# Patient Record
Sex: Female | Born: 1965
Health system: Southern US, Community
[De-identification: ages and names within clinical notes are randomized; demographics above are authoritative.]

## PROBLEM LIST (undated history)

## (undated) DIAGNOSIS — Z720 Tobacco use: Secondary | ICD-10-CM

## (undated) DIAGNOSIS — E669 Obesity, unspecified: Secondary | ICD-10-CM

## (undated) DIAGNOSIS — I10 Essential (primary) hypertension: Secondary | ICD-10-CM

## (undated) DIAGNOSIS — J45909 Unspecified asthma, uncomplicated: Secondary | ICD-10-CM

## (undated) HISTORY — DX: Obesity, unspecified: E66.9

## (undated) HISTORY — PX: WISDOM TOOTH EXTRACTION: SHX21

## (undated) HISTORY — DX: Tobacco use: Z72.0

---

## 1999-04-05 ENCOUNTER — Emergency Department (HOSPITAL_COMMUNITY): Admission: EM | Admit: 1999-04-05 | Discharge: 1999-04-05 | Payer: Self-pay

## 1999-09-26 ENCOUNTER — Emergency Department (HOSPITAL_COMMUNITY): Admission: EM | Admit: 1999-09-26 | Discharge: 1999-09-26 | Payer: Self-pay | Admitting: Emergency Medicine

## 2000-10-30 ENCOUNTER — Emergency Department (HOSPITAL_COMMUNITY): Admission: EM | Admit: 2000-10-30 | Discharge: 2000-10-30 | Payer: Self-pay | Admitting: Emergency Medicine

## 2000-10-30 ENCOUNTER — Encounter: Payer: Self-pay | Admitting: Emergency Medicine

## 2000-11-03 ENCOUNTER — Encounter: Admission: RE | Admit: 2000-11-03 | Discharge: 2000-11-17 | Payer: Self-pay | Admitting: Family Medicine

## 2000-12-15 ENCOUNTER — Inpatient Hospital Stay (HOSPITAL_COMMUNITY): Admission: AD | Admit: 2000-12-15 | Discharge: 2000-12-15 | Payer: Self-pay | Admitting: Radiology

## 2000-12-16 ENCOUNTER — Encounter: Payer: Self-pay | Admitting: Obstetrics & Gynecology

## 2000-12-16 ENCOUNTER — Inpatient Hospital Stay (HOSPITAL_COMMUNITY): Admission: AD | Admit: 2000-12-16 | Discharge: 2000-12-16 | Payer: Self-pay | Admitting: *Deleted

## 2000-12-19 ENCOUNTER — Inpatient Hospital Stay (HOSPITAL_COMMUNITY): Admission: AD | Admit: 2000-12-19 | Discharge: 2000-12-19 | Payer: Self-pay | Admitting: Obstetrics & Gynecology

## 2000-12-22 ENCOUNTER — Inpatient Hospital Stay (HOSPITAL_COMMUNITY): Admission: AD | Admit: 2000-12-22 | Discharge: 2000-12-22 | Payer: Self-pay | Admitting: Obstetrics and Gynecology

## 2000-12-22 ENCOUNTER — Encounter: Payer: Self-pay | Admitting: Obstetrics and Gynecology

## 2001-06-26 ENCOUNTER — Encounter: Admission: RE | Admit: 2001-06-26 | Discharge: 2001-06-26 | Payer: Self-pay | Admitting: Family Medicine

## 2001-08-04 ENCOUNTER — Other Ambulatory Visit: Admission: RE | Admit: 2001-08-04 | Discharge: 2001-08-04 | Payer: Self-pay | Admitting: *Deleted

## 2001-08-04 LAB — CONVERTED CEMR LAB: Pap Smear: NORMAL

## 2001-08-29 ENCOUNTER — Inpatient Hospital Stay (HOSPITAL_COMMUNITY): Admission: AD | Admit: 2001-08-29 | Discharge: 2001-08-29 | Payer: Self-pay | Admitting: *Deleted

## 2001-09-12 ENCOUNTER — Encounter: Admission: RE | Admit: 2001-09-12 | Discharge: 2001-09-12 | Payer: Self-pay | Admitting: Family Medicine

## 2001-09-20 ENCOUNTER — Ambulatory Visit (HOSPITAL_COMMUNITY): Admission: RE | Admit: 2001-09-20 | Discharge: 2001-09-20 | Payer: Self-pay | Admitting: Family Medicine

## 2001-10-16 ENCOUNTER — Inpatient Hospital Stay (HOSPITAL_COMMUNITY): Admission: AD | Admit: 2001-10-16 | Discharge: 2001-10-16 | Payer: Self-pay | Admitting: Obstetrics

## 2001-10-16 ENCOUNTER — Encounter: Admission: RE | Admit: 2001-10-16 | Discharge: 2001-10-16 | Payer: Self-pay | Admitting: Sports Medicine

## 2001-10-18 ENCOUNTER — Ambulatory Visit (HOSPITAL_COMMUNITY): Admission: RE | Admit: 2001-10-18 | Discharge: 2001-10-18 | Payer: Self-pay | Admitting: Sports Medicine

## 2001-11-15 ENCOUNTER — Encounter: Admission: RE | Admit: 2001-11-15 | Discharge: 2001-11-15 | Payer: Self-pay | Admitting: Family Medicine

## 2001-12-13 ENCOUNTER — Encounter: Admission: RE | Admit: 2001-12-13 | Discharge: 2001-12-13 | Payer: Self-pay | Admitting: Family Medicine

## 2002-01-10 ENCOUNTER — Encounter: Admission: RE | Admit: 2002-01-10 | Discharge: 2002-01-10 | Payer: Self-pay | Admitting: Family Medicine

## 2002-01-12 ENCOUNTER — Encounter: Admission: RE | Admit: 2002-01-12 | Discharge: 2002-01-12 | Payer: Self-pay | Admitting: Family Medicine

## 2002-01-26 ENCOUNTER — Encounter (HOSPITAL_COMMUNITY): Admission: RE | Admit: 2002-01-26 | Discharge: 2002-01-26 | Payer: Self-pay | Admitting: Obstetrics

## 2002-01-29 ENCOUNTER — Encounter (INDEPENDENT_AMBULATORY_CARE_PROVIDER_SITE_OTHER): Payer: Self-pay

## 2002-01-29 ENCOUNTER — Inpatient Hospital Stay (HOSPITAL_COMMUNITY): Admission: AD | Admit: 2002-01-29 | Discharge: 2002-02-01 | Payer: Self-pay | Admitting: *Deleted

## 2002-01-29 ENCOUNTER — Encounter: Admission: RE | Admit: 2002-01-29 | Discharge: 2002-01-29 | Payer: Self-pay | Admitting: Family Medicine

## 2002-04-24 ENCOUNTER — Encounter: Admission: RE | Admit: 2002-04-24 | Discharge: 2002-04-24 | Payer: Self-pay | Admitting: Family Medicine

## 2002-05-28 ENCOUNTER — Encounter: Admission: RE | Admit: 2002-05-28 | Discharge: 2002-05-28 | Payer: Self-pay | Admitting: Family Medicine

## 2002-07-27 ENCOUNTER — Encounter: Admission: RE | Admit: 2002-07-27 | Discharge: 2002-07-27 | Payer: Self-pay | Admitting: Family Medicine

## 2003-06-20 ENCOUNTER — Encounter: Admission: RE | Admit: 2003-06-20 | Discharge: 2003-06-20 | Payer: Self-pay | Admitting: Family Medicine

## 2004-06-23 ENCOUNTER — Encounter: Admission: RE | Admit: 2004-06-23 | Discharge: 2004-06-23 | Payer: Self-pay | Admitting: Family Medicine

## 2004-06-25 ENCOUNTER — Encounter: Admission: RE | Admit: 2004-06-25 | Discharge: 2004-06-25 | Payer: Self-pay | Admitting: Family Medicine

## 2005-02-02 ENCOUNTER — Inpatient Hospital Stay (HOSPITAL_COMMUNITY): Admission: AD | Admit: 2005-02-02 | Discharge: 2005-02-02 | Payer: Self-pay | Admitting: Obstetrics & Gynecology

## 2005-04-10 ENCOUNTER — Emergency Department (HOSPITAL_COMMUNITY): Admission: EM | Admit: 2005-04-10 | Discharge: 2005-04-10 | Payer: Self-pay | Admitting: Emergency Medicine

## 2005-04-14 ENCOUNTER — Ambulatory Visit: Payer: Self-pay | Admitting: Family Medicine

## 2005-04-14 LAB — CONVERTED CEMR LAB: Pap Smear: NORMAL

## 2005-04-27 ENCOUNTER — Encounter: Admission: RE | Admit: 2005-04-27 | Discharge: 2005-04-27 | Payer: Self-pay | Admitting: Sports Medicine

## 2005-04-27 ENCOUNTER — Ambulatory Visit: Payer: Self-pay | Admitting: Family Medicine

## 2005-08-28 ENCOUNTER — Emergency Department (HOSPITAL_COMMUNITY): Admission: EM | Admit: 2005-08-28 | Discharge: 2005-08-28 | Payer: Self-pay | Admitting: Emergency Medicine

## 2005-09-20 ENCOUNTER — Emergency Department (HOSPITAL_COMMUNITY): Admission: EM | Admit: 2005-09-20 | Discharge: 2005-09-20 | Payer: Self-pay | Admitting: Emergency Medicine

## 2006-06-05 ENCOUNTER — Encounter (INDEPENDENT_AMBULATORY_CARE_PROVIDER_SITE_OTHER): Payer: Self-pay | Admitting: *Deleted

## 2006-06-07 ENCOUNTER — Ambulatory Visit: Payer: Self-pay | Admitting: Family Medicine

## 2006-06-07 ENCOUNTER — Other Ambulatory Visit: Admission: RE | Admit: 2006-06-07 | Discharge: 2006-06-07 | Payer: Self-pay | Admitting: Family Medicine

## 2006-06-07 LAB — CONVERTED CEMR LAB: Pap Smear: NORMAL

## 2006-07-06 ENCOUNTER — Ambulatory Visit: Payer: Self-pay | Admitting: Family Medicine

## 2006-07-06 ENCOUNTER — Ambulatory Visit (HOSPITAL_COMMUNITY): Admission: RE | Admit: 2006-07-06 | Discharge: 2006-07-06 | Payer: Self-pay | Admitting: Family Medicine

## 2006-07-06 LAB — CONVERTED CEMR LAB
LDL Cholesterol: 82 mg/dL
Triglycerides: 111 mg/dL
VLDL: 22 mg/dL

## 2006-07-07 ENCOUNTER — Encounter: Admission: RE | Admit: 2006-07-07 | Discharge: 2006-07-07 | Payer: Self-pay | Admitting: Sports Medicine

## 2006-07-13 ENCOUNTER — Ambulatory Visit: Payer: Self-pay | Admitting: Family Medicine

## 2006-08-04 ENCOUNTER — Ambulatory Visit: Payer: Self-pay | Admitting: Family Medicine

## 2006-08-04 LAB — CONVERTED CEMR LAB
ALT: 10 units/L
AST: 10 units/L
Albumin: 4.1 g/dL
Alkaline Phosphatase: 46 units/L
CO2: 24 meq/L
Sodium: 142 meq/L
Total Bilirubin: 0.4 mg/dL
Total Protein: 6.7 g/dL

## 2007-02-02 DIAGNOSIS — J45909 Unspecified asthma, uncomplicated: Secondary | ICD-10-CM

## 2007-02-02 DIAGNOSIS — Z6841 Body Mass Index (BMI) 40.0 and over, adult: Secondary | ICD-10-CM

## 2007-02-02 DIAGNOSIS — Z720 Tobacco use: Secondary | ICD-10-CM

## 2007-02-03 ENCOUNTER — Encounter (INDEPENDENT_AMBULATORY_CARE_PROVIDER_SITE_OTHER): Payer: Self-pay | Admitting: *Deleted

## 2007-04-20 ENCOUNTER — Telehealth: Payer: Self-pay | Admitting: Family Medicine

## 2007-05-15 ENCOUNTER — Telehealth: Payer: Self-pay | Admitting: *Deleted

## 2007-08-24 ENCOUNTER — Encounter (INDEPENDENT_AMBULATORY_CARE_PROVIDER_SITE_OTHER): Payer: Self-pay | Admitting: *Deleted

## 2007-12-04 ENCOUNTER — Encounter: Payer: Self-pay | Admitting: *Deleted

## 2008-02-09 ENCOUNTER — Ambulatory Visit: Payer: Self-pay | Admitting: Family Medicine

## 2008-02-12 ENCOUNTER — Ambulatory Visit: Payer: Self-pay | Admitting: Sports Medicine

## 2008-06-19 ENCOUNTER — Encounter (INDEPENDENT_AMBULATORY_CARE_PROVIDER_SITE_OTHER): Payer: Self-pay | Admitting: *Deleted

## 2008-07-31 ENCOUNTER — Encounter (INDEPENDENT_AMBULATORY_CARE_PROVIDER_SITE_OTHER): Payer: Self-pay | Admitting: *Deleted

## 2008-10-02 ENCOUNTER — Telehealth (INDEPENDENT_AMBULATORY_CARE_PROVIDER_SITE_OTHER): Payer: Self-pay | Admitting: *Deleted

## 2008-11-04 ENCOUNTER — Telehealth: Payer: Self-pay | Admitting: *Deleted

## 2008-11-05 ENCOUNTER — Encounter: Payer: Self-pay | Admitting: *Deleted

## 2008-12-20 ENCOUNTER — Encounter: Payer: Self-pay | Admitting: Family Medicine

## 2008-12-20 ENCOUNTER — Ambulatory Visit: Payer: Self-pay | Admitting: Family Medicine

## 2008-12-20 ENCOUNTER — Other Ambulatory Visit: Admission: RE | Admit: 2008-12-20 | Discharge: 2008-12-20 | Payer: Self-pay | Admitting: *Deleted

## 2008-12-20 ENCOUNTER — Telehealth: Payer: Self-pay | Admitting: Family Medicine

## 2008-12-20 DIAGNOSIS — F39 Unspecified mood [affective] disorder: Secondary | ICD-10-CM | POA: Insufficient documentation

## 2008-12-20 DIAGNOSIS — I10 Essential (primary) hypertension: Secondary | ICD-10-CM | POA: Insufficient documentation

## 2008-12-20 LAB — CONVERTED CEMR LAB
Calcium: 9.7 mg/dL (ref 8.4–10.5)
Chlamydia, DNA Probe: NEGATIVE
GC Probe Amp, Genital: NEGATIVE

## 2008-12-21 ENCOUNTER — Encounter: Payer: Self-pay | Admitting: Family Medicine

## 2008-12-24 ENCOUNTER — Telehealth: Payer: Self-pay | Admitting: Family Medicine

## 2008-12-24 ENCOUNTER — Encounter: Payer: Self-pay | Admitting: Family Medicine

## 2008-12-25 ENCOUNTER — Telehealth: Payer: Self-pay | Admitting: *Deleted

## 2008-12-26 ENCOUNTER — Encounter: Payer: Self-pay | Admitting: Family Medicine

## 2009-01-17 ENCOUNTER — Telehealth: Payer: Self-pay | Admitting: Family Medicine

## 2009-01-24 ENCOUNTER — Telehealth: Payer: Self-pay | Admitting: Family Medicine

## 2009-01-27 ENCOUNTER — Ambulatory Visit: Payer: Self-pay | Admitting: Family Medicine

## 2009-01-27 DIAGNOSIS — K219 Gastro-esophageal reflux disease without esophagitis: Secondary | ICD-10-CM | POA: Insufficient documentation

## 2009-01-27 LAB — CONVERTED CEMR LAB: H Pylori IgG: POSITIVE

## 2009-01-31 ENCOUNTER — Ambulatory Visit (HOSPITAL_COMMUNITY): Admission: RE | Admit: 2009-01-31 | Discharge: 2009-01-31 | Payer: Self-pay | Admitting: *Deleted

## 2009-02-10 ENCOUNTER — Telehealth (INDEPENDENT_AMBULATORY_CARE_PROVIDER_SITE_OTHER): Payer: Self-pay | Admitting: *Deleted

## 2009-03-17 ENCOUNTER — Ambulatory Visit: Payer: Self-pay | Admitting: Family Medicine

## 2009-05-24 ENCOUNTER — Emergency Department (HOSPITAL_COMMUNITY): Admission: EM | Admit: 2009-05-24 | Discharge: 2009-05-24 | Payer: Self-pay | Admitting: Emergency Medicine

## 2009-05-26 ENCOUNTER — Telehealth: Payer: Self-pay | Admitting: Family Medicine

## 2009-05-27 ENCOUNTER — Encounter: Payer: Self-pay | Admitting: Family Medicine

## 2009-05-27 ENCOUNTER — Ambulatory Visit: Payer: Self-pay | Admitting: Family Medicine

## 2009-06-03 ENCOUNTER — Ambulatory Visit: Payer: Self-pay | Admitting: Family Medicine

## 2009-06-03 ENCOUNTER — Encounter (INDEPENDENT_AMBULATORY_CARE_PROVIDER_SITE_OTHER): Payer: Self-pay | Admitting: *Deleted

## 2009-07-21 ENCOUNTER — Telehealth: Payer: Self-pay | Admitting: Family Medicine

## 2009-08-27 ENCOUNTER — Encounter (INDEPENDENT_AMBULATORY_CARE_PROVIDER_SITE_OTHER): Payer: Self-pay | Admitting: *Deleted

## 2009-10-22 ENCOUNTER — Ambulatory Visit: Payer: Self-pay | Admitting: Family Medicine

## 2009-10-22 ENCOUNTER — Encounter: Payer: Self-pay | Admitting: Family Medicine

## 2009-10-22 DIAGNOSIS — M25579 Pain in unspecified ankle and joints of unspecified foot: Secondary | ICD-10-CM

## 2009-10-22 LAB — CONVERTED CEMR LAB
Bilirubin Urine: NEGATIVE
GC Probe Amp, Genital: NEGATIVE
Glucose, Urine, Semiquant: NEGATIVE
Nitrite: NEGATIVE
Protein, U semiquant: 30
Urobilinogen, UA: 0.2
WBC Urine, dipstick: NEGATIVE

## 2009-10-27 ENCOUNTER — Telehealth: Payer: Self-pay | Admitting: Family Medicine

## 2009-12-17 ENCOUNTER — Telehealth: Payer: Self-pay | Admitting: Family Medicine

## 2009-12-26 ENCOUNTER — Ambulatory Visit: Payer: Self-pay | Admitting: Family Medicine

## 2010-04-22 ENCOUNTER — Ambulatory Visit: Payer: Self-pay | Admitting: Family Medicine

## 2010-05-07 ENCOUNTER — Encounter: Payer: Self-pay | Admitting: *Deleted

## 2010-05-11 ENCOUNTER — Telehealth: Payer: Self-pay | Admitting: Family Medicine

## 2010-05-11 ENCOUNTER — Ambulatory Visit: Payer: Self-pay | Admitting: Family Medicine

## 2010-05-25 ENCOUNTER — Telehealth: Payer: Self-pay | Admitting: Family Medicine

## 2010-06-06 ENCOUNTER — Emergency Department (HOSPITAL_COMMUNITY): Admission: EM | Admit: 2010-06-06 | Discharge: 2010-06-06 | Payer: Self-pay | Admitting: Emergency Medicine

## 2010-06-16 ENCOUNTER — Telehealth: Payer: Self-pay | Admitting: Family Medicine

## 2010-07-31 ENCOUNTER — Telehealth: Payer: Self-pay | Admitting: *Deleted

## 2010-09-02 ENCOUNTER — Encounter: Payer: Self-pay | Admitting: Family Medicine

## 2010-09-11 ENCOUNTER — Encounter: Payer: Self-pay | Admitting: Family Medicine

## 2010-10-02 ENCOUNTER — Ambulatory Visit: Payer: Self-pay | Admitting: Family Medicine

## 2010-10-16 ENCOUNTER — Telehealth: Payer: Self-pay | Admitting: *Deleted

## 2010-12-21 ENCOUNTER — Encounter: Payer: Self-pay | Admitting: Family Medicine

## 2010-12-27 ENCOUNTER — Encounter: Payer: Self-pay | Admitting: Family Medicine

## 2011-01-07 NOTE — Letter (Signed)
Summary: Probation Letter  [facility]  [address]   [place], [place] [address]   Phone: [phone]  Fax: [phone]    05/07/2010  Regina Leach [address] [place], [place]  [address]  Dear Ms. Aoun,  With the goal of better serving all our patients the [facility] is following each patient's missed appointments.  You have missed at least 3 appointments with our practice.If you cannot keep your appointment, we expect you to call at least 24 hours before your appointment time.  Missing appointments prevents other patients from seeing [place] and makes it difficult to provide you with the best possible medical care.      1.   If you miss one more appointment, we will only give you limited medical services. This means we will not call in medication refills, complete a form, or make a referral for you except when you are here for a scheduled office visit.    2.   If you miss 2 or more appointments in the next year, we will dismiss you from our practice.    Our office staff can be reached at [phone] Monday through Friday from 8:30 a.m.-5:00 p.m. and will be glad to schedule your appointment as necessary.    Thank you.   [facility]  Appended Document: Probation Letter Letter returned unable to forward.
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
## 2011-01-07 NOTE — Progress Notes (Signed)
Summary: cxl  Phone Note Call from Patient Call back at Home Phone [phone]   Caller: Patient Summary of Call: pt tried really hard to get transportation for today and can't come today Initial call taken by: De Nurse,  October 16, 2010 9:24 AM

## 2011-01-07 NOTE — Miscellaneous (Signed)
  

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
## 2011-01-07 NOTE — Progress Notes (Signed)
Summary: wi request  Phone Note Call from Patient Call back at [phone]   Reason for Call: Talk to Nurse Summary of Call: pt is having back pain/numbness in her fingers, wants to be seen Initial call taken by: Knox Royalty,  June 16, 2010 3:42 PM  Follow-up for Phone Call        went to [facility] for back pain over the weekend. no tests done. they gave her hydrocodone which has not helped. states she is allergic to ibuprofen.  pain lower back & down L leg. R arm feels "funny too". pinkie finger is numb. states no meds have helped. pain started 3 wks ago.  offered UC now or appt tomorrow. she will be seen in 11am work in . aware of wait. kept saying she wants to know why she is hurting "it is not about the pain meds" Follow-up by: Golden Circle RN,  June 16, 2010 3:51 PM

## 2011-01-07 NOTE — Miscellaneous (Addendum)
Summary: PA form  Clinical Lists Changes PA is required for meloxicam.  form placed in MD box. Theresia Lo RN  December 21, 2010 3:07 PM'   Finished. I'll bring it to clinic tomorrow. Demetria Pore MD  December 24, 2010 1:34 PM   Appended Document: PA form can't locate the PA form that MD completed. cannot find that it has been faxed previously. received another request for PA. filled out again and placed in MD ox.  Appended Document: PA form I have it in my bag.  I must have forgotten to put it in the box on Friday when I was there.  I'm so sorry.  I start nights tomorrow but I will drop it off ASAP.  Appended Document: PA form approval received  and pharmacy notified.Marland Kitchen

## 2011-01-07 NOTE — Progress Notes (Signed)
Summary: triage  Phone Note Call from Patient Call back at Home Phone [phone]   Caller: Patient Summary of Call: New bp meds seems to be making her more sleepy and urinate more.  Could this be because it is just gettting in her system.   Initial call taken by: Clydell Hakim,  May 25, 2010 12:22 PM  Follow-up for Phone Call        discussed HTN & fluid retention & meds.   also with the hot weather she may be drinking more & urinating more. she has a/c in her home. went into detail about hidden & obvious salt in foods. she will read labels & choose low salt items & avoid chips, lunch meat,hotdogs, etc. no added salt at table or in cooking.  she wants to know if the HCTZ is what is making her so sleepy. admits to stress right now. told her I will check with md & call her tomorrow Follow-up by: Golden Circle RN,  May 25, 2010 3:11 PM  Additional Follow-up for Phone Call Additional follow up Details #1::        HCTZ doesn't usually do that, but it is possible.  However she has been on it in the past (had just ran out ofr 2 weeks, or so she told me) and I don't recall her having a problem with it before.  Suspect it is not the HCTZ but if she really wants to try a different med we can.  Additional Follow-up by: Lamar Laundry, MD June 21st, 2011     Additional Follow-up for Phone Call Additional follow up Details #2::    discussed above with her. she will keep taking it for 2 weeks. if her body has not adjustd to the 25mg  HCTZ, she will call us for a different med. Follow-up by: Golden Circle RN,  May 26, 2010 11:32 AM

## 2011-01-07 NOTE — Progress Notes (Signed)
Summary: meds prob  Phone Note Call from Patient Call back at [phone]   Caller: Patient Summary of Call: pt opened her meds and it all fell into the dishwater - needs another refill on her hydrocodone [facility] [address] Initial call taken by: De Nurse,  July 31, 2010 9:42 AM  Follow-up for Phone Call        pt is in pain this am and wants to know if we could call it in asap. made appt w/ [facility] for 9/14 Follow-up by: De Nurse,  July 31, 2010 10:38 AM  Additional Follow-up for Phone Call Additional follow up Details #1::        wants to know if she needs to go to ED for her pain meds? Additional Follow-up by: De Nurse,  July 31, 2010 3:58 PM    Additional Follow-up for Phone Call Additional follow up Details #2::    I would like to see pt before prescribing pain meds. If she feels she cannot wait until 9/14, then she may go to the ED or urgent care to be seen for this problem.  Additional Follow-up for Phone Call Additional follow up Details #3:: Details for Additional Follow-up Action Taken: pt states that she will be going to ER Additional Follow-up by: De Nurse,  August 03, 2010 9:36 AM

## 2011-01-07 NOTE — Progress Notes (Signed)
Summary: triage  Phone Note Call from Patient Call back at [phone]   Caller: Patient Summary of Call: Having stomach virus all week and stool is black. Initial call taken by: Clydell Hakim,  December 17, 2009 9:50 AM  Follow-up for Phone Call        left message Follow-up by: Golden Circle RN,  December 17, 2009 9:54 AM  Additional Follow-up for Phone Call Additional follow up Details #1::        had diarrhea x 3 days. 10 times a day or more. stools are now black. told her to come in now. she agreed Additional Follow-up by: Golden Circle RN,  December 17, 2009 9:59 AM

## 2011-01-07 NOTE — Assessment & Plan Note (Signed)
Summary: itching all over,df   Vital Signs:  Patient profile:   45 year old female Height:      62 inches Weight:      205.3 pounds BMI:     37.69 Temp:     98.6 degrees F oral Pulse rate:   85 / minute BP sitting:   151 / 93  (left arm) Cuff size:   regular  Vitals Entered By: Garen Grams LPN (October 02, 2010 11:07 AM) CC: itching all over x 3 months Is Patient Diabetic? No Pain Assessment Patient in pain? yes     Location: right foot   Primary Care Provider:  Ardeen Garland  MD  CC:  itching all over x 3 months.  History of Present Illness: Itching all over for 3 months, she is not sure what has caused this.  She has not started any new meds, did start HCTZ in May.  There has been talk around the building that she lives in that there is bed bugs and scabies.  Her 'home boy' was treated for scabies, and she used his cream and develped chest pain. She goes to bed at night and scratches for one hour. She has not noticed a rash.  She has not been taking her Seroquel.  She has been out of her hydrocodone that she takes as needed for an old foot injury.  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  They hydrocodone helps her when her left foot is very painful after work.  She reports [facility] for [facility].  She needs to meet her new doctor and get established and is due for CPE, she will schedule.  Habits & Providers  Alcohol-Tobacco-Diet     Tobacco Status: current     Tobacco Counseling: to quit use of tobacco products     Cigarette Packs/Day: <0.25  Current Medications (verified): 1)  Hydrochlorothiazide 25 Mg Tabs (Hydrochlorothiazide) .Marland Kitchen.. 1 Tab By Mouth Daily For High Blood Pressure 2)  Hydrocodone-Acetaminophen 7.5-500 Mg Tabs (Hydrocodone-Acetaminophen) .Marland Kitchen.. 1 Tab By Mouth Q 4-6 Hrs As Needed Pain 3)  Seroquel 100 Mg Tabs (Quetiapine Fumarate) .... 1/2 Tab Qhs For 2 Days Then 1 Tab Qhs Thereafter 4)  Ventolin Hfa 108 (90 Base) Mcg/act Aers (Albuterol Sulfate)  .... 2 Puffs Q 4 Hours As Needed Wheezing 5)  Nexium 40 Mg Cpdr (Esomeprazole Magnesium) .Marland Kitchen.. 1 Tab By Mouth Daily For Acid Reflux 6)  Flonase 50 Mcg/act Susp (Fluticasone Propionate) .... 2 Sprays Each Nostril Daily For 2 Weeks Dispense: 1 Bottle 7)  Meloxicam 7.5 Mg Tabs (Meloxicam) .Marland Kitchen.. 1 Tab By Mouth Daily For Ankle Pain 8)  Benzonatate 100 Mg Caps (Benzonatate) .Marland Kitchen.. 1 Tab By Mouth Q 6 Hrs As Needed Cough 9)  Triamcinolone Acetonide 0.1 % Crea (Triamcinolone Acetonide) .... Apply To Affected Area Two Times A Day As Needed Itching Disp: 60g 10)  Hydroxyzine Hcl 50 Mg Tabs (Hydroxyzine Hcl) .... 1/2 To One Tab At Bedtime As Needed For Itching 11)  Permethrin 5 % Crea (Permethrin) .... Apply From Neck To Toes, Supply Enough For 2 Applications  Allergies (verified): No Known Drug Allergies  Social History: Smoking Status:  current  Review of Systems      See HPI  Physical Exam  General:  Alert, overweight, in scrubs from work Skin:  dry, no specific rash, skin was reddened, few dry patches, some bite like lesions on her arms.   Impression & Recommendations:  Problem # 1:  PRURITUS (ICD-698.9)  Non specific, since there is report of scabies and this is her biggest concern will treat empherically with premethrin 5% cream with directions.  Add hydroxyzine at bedtime.  Moisturize.  Orders: [facility]- Est Level  3 (22025)  Problem # 2:  ANKLE PAIN, CHRONIC (ICD-719.47)  Refilled hydrocodone for one per day for chronic pain.  She will need to discuss with new MD and establish plan for chronic meds.  Orders: [facility]- Est Level  3 (42706)  Complete Medication List: 1)  Hydrochlorothiazide 25 Mg Tabs (Hydrochlorothiazide) .Marland Kitchen.. 1 tab by mouth daily for high blood pressure 2)  Hydrocodone-acetaminophen 7.5-500 Mg Tabs (Hydrocodone-acetaminophen) .Marland Kitchen.. 1 tab by mouth q 4-6 hrs as needed pain 3)  Seroquel 100 Mg Tabs (Quetiapine fumarate) .... 1/2 tab qhs for 2 days then 1 tab qhs thereafter 4)   Ventolin Hfa 108 (90 Base) Mcg/act Aers (Albuterol sulfate) .... 2 puffs q 4 hours as needed wheezing 5)  Nexium 40 Mg Cpdr (Esomeprazole magnesium) .Marland Kitchen.. 1 tab by mouth daily for acid reflux 6)  Flonase 50 Mcg/act Susp (Fluticasone propionate) .... 2 sprays each nostril daily for 2 weeks dispense: 1 bottle 7)  Meloxicam 7.5 Mg Tabs (Meloxicam) .Marland Kitchen.. 1 tab by mouth daily for ankle pain 8)  Triamcinolone Acetonide 0.1 % Crea (Triamcinolone acetonide) .... Apply to affected area two times a day as needed itching disp: 60g 9)  Hydroxyzine Hcl 50 Mg Tabs (Hydroxyzine hcl) .... 1/2 to one tab at bedtime as needed for itching 10)  Permethrin 5 % Crea (Permethrin) .... Apply from neck to toes, supply enough for 2 applications  Other Orders: Influenza Vaccine NON MCR (23762)  Patient Instructions: 1)  Please schedule a follow-up appointment in 1 month or when avialble in that time frame with primary MD-for a CPE 2)  Use the cream as directed 3)  Wash sheets after treatment 4)  Use medicine for itching  Prescriptions: HYDROCODONE-ACETAMINOPHEN 7.5-500 MG TABS (HYDROCODONE-ACETAMINOPHEN) 1 tab by mouth q 4-6 hrs as needed pain Brand medically necessary #30 x 0   Entered and Authorized by:   Luretha Murphy NP   Signed by:   Luretha Murphy NP on 10/02/2010   Method used:   Print then Give to Patient   RxID:   [id number] PERMETHRIN 5 % CREA (PERMETHRIN) apply from neck to toes, supply enough for 2 applications  #1 x 1   Entered and Authorized by:   Luretha Murphy NP   Signed by:   Luretha Murphy NP on 10/02/2010   Method used:   Electronically to        [facility] [phone]* (retail)       [address]       [place], [place]  [address]       Ph: [phone]       Fax: [phone]   RxID:   [id number] HYDROXYZINE HCL 50 MG TABS (HYDROXYZINE HCL) 1/2 to one tab at bedtime as needed for itching  #30 x 1   Entered and Authorized by:   Luretha Murphy NP   Signed by:   Luretha Murphy NP on 10/02/2010    Method used:   Electronically to        [facility] [phone]* (retail)       [address]       [place], [place]  [address]       Ph: [phone]       Fax: [phone]   RxID:   [id number]  Orders Added: 1)  Influenza Vaccine NON MCR [00028] 2)  [facility]- Est Level  3 [16109]   Immunizations Administered:  Influenza Vaccine # 1:    Vaccine Type: Fluvax Non-MCR    Site: left deltoid    Mfr: GlaxoSmithKline    Dose: 0.5 ml    Route: IM    Given by: Tessie Fass CMA    Exp. Date: 06/02/2011    Lot #: [id number]    VIS given: 06/30/10 version given October 02, 2010.  Flu Vaccine Consent Questions:    Do you have a history of severe allergic reactions to this vaccine? no    Any prior history of allergic reactions to egg and/or gelatin? no    Do you have a sensitivity to the preservative Thimersol? no    Do you have a past history of Guillan-Barre Syndrome? no    Do you currently have an acute febrile illness? no    Have you ever had a severe reaction to latex? no    Vaccine information given and explained to patient? yes    Are you currently pregnant? no   Immunizations Administered:  Influenza Vaccine # 1:    Vaccine Type: Fluvax Non-MCR    Site: left deltoid    Mfr: GlaxoSmithKline    Dose: 0.5 ml    Route: IM    Given by: Tessie Fass CMA    Exp. Date: 06/02/2011    Lot #: [id number]    VIS given: 06/30/10 version given October 02, 2010.

## 2011-01-07 NOTE — Assessment & Plan Note (Signed)
Summary: f/up,tcb   Vital Signs:  Patient profile:   45 year old female Height:      62 inches Weight:      198 pounds BMI:     36.35 Temp:     97.8 degrees F oral Pulse rate:   71 / minute BP sitting:   144 / 86  (left arm) Cuff size:   large  Vitals Entered By: Tessie Fass CMA (December 26, 2009 9:39 AM) CC: discuss several issues Is Patient Diabetic? No Pain Assessment Patient in pain? yes     Location: foot Intensity: 10   Primary Care Provider:  Ardeen Garland  MD  CC:  discuss several issues.  History of Present Illness: Ms. Barga comes in today to discuss several issues. 1) knot behind her left ear - just noticed it a couple days ago when fixing her hair.  Doesn't hurt.  Doesn't itch.  Doesn't bother her at all other than she is just curious what it is.  No ear pain.   2) Right foot pain - has chronic pain in right foot/ankle from previous sugeries.  Has gone through PT and will have some degree of chronic pain in it.  Mobic and Hydrocodone have kept it bearable but ran out of her pain meds about 2 weeks ago and it is bothering her badly again.  Looked at refill history, has year supply of mobic but she didn't realize she had refills.  Did not have a refill left on hydrocodone. Uses one mobic and one hydrocodone a day, sometimes 2.  3) GERD - on omeprazole.  Was helping but then seemed to stop.  STarted taking 2 and it is still bothering her.  Wants to know if there is something stronger she can take.  Still having burnign up into throat, bad taste in mouth, and nausea.  4) Tooth pain - seen in November for tooth pain.  Found to have infected tooth.  SHe had appt with her dentist set up for the following week.  Was given Pen VK to take until she saw him.  Unfortuantely, the dentist cancelled the appointment due to office renovations (???per patient).  Per patient, they still haven't reopened but are hoping to by the end of the month.  Tooth felt better while she was on the Pen  VK and tooth is hurting again.  No fevers.  No drainage or discharge.   Habits & Providers  Alcohol-Tobacco-Diet     Tobacco Status: current     Cigarette Packs/Day: <0.25  Allergies: No Known Drug Allergies  Social History: Packs/Day:  <0.25  Physical Exam  General:  obese, alert, NAD vitals reviewed Head:  small, mobile, nontender lump palpated behind left ear.  No erythema or fluctuance.  No swelling.   Ears:  bilateral TM's clear Mouth:  poor dentition.  Right upper molar (middle) with redness and swelling at gum line.  Tender to palpation.  No discharge.  Lungs:  Normal respiratory effort, chest expands symmetrically. Lungs are clear to auscultation, no crackles or wheezes. Abdomen:  Bowel sounds positive,abdomen soft and non-tender without masses, organomegaly or hernias noted.   Impression & Recommendations:  Problem # 1:  SKIN LESION (ICD-709.9) Assessment New  Unclear what bump behind ear is.  ? early pimple/cyst vs. lipoma.  No sign of infection. Not tender.  Did not feel like lymph node.  Watch.  If gets bigger or does not resolve over next 4-6 weeks, she will return.   Orders: [facility]-  Est  Level 4 (19147)  Problem # 2:  ANKLE PAIN, CHRONIC (ICD-719.47) Assessment: Deteriorated  Refill hydrocodone, mobic.  Continue this regimen as this keeps pain bearable.  Using meds appropriately.   Orders: [facility]- Est  Level 4 (82956)  Problem # 3:  GERD (ICD-530.81) Assessment: Deteriorated  Change omeprazole to Nexium.  Her updated medication list for this problem includes:    Nexium 40 Mg Cpdr (Esomeprazole magnesium) .Marland Kitchen... 1 tab by mouth daily for acid reflux  Orders: [facility]- Est  Level 4 (21308)  Problem # 4:  ABSCESS, TOOTH (ICD-522.5) Assessment: Deteriorated Gave pen VK again.  Already has hydrocodone.  Will try to  confirm when her dentist open will reopen.  If not in next 2 weeks she will try to find a different dentist or let us know so we can try to submit an  emergency dental referral for her.   Complete Medication List: 1)  Hydrochlorothiazide 12.5 Mg Tabs (Hydrochlorothiazide) .Marland Kitchen.. 1 tab by mouth daily 2)  Hydrocodone-acetaminophen 7.5-500 Mg Tabs (Hydrocodone-acetaminophen) .Marland Kitchen.. 1 tab by mouth q 4-6 hrs as needed pain 3)  Seroquel 100 Mg Tabs (Quetiapine fumarate) .... 1/2 tab qhs for 2 days then 1 tab qhs thereafter 4)  Ventolin Hfa 108 (90 Base) Mcg/act Aers (Albuterol sulfate) .... 2 puffs q 4 hours as needed wheezing 5)  Nexium 40 Mg Cpdr (Esomeprazole magnesium) .Marland Kitchen.. 1 tab by mouth daily for acid reflux 6)  Doxycycline Hyclate 100 Mg Tabs (Doxycycline hyclate) .... Two times a day for 10 days 7)  Tylenol With Codeine #3 300-30 Mg Tabs (Acetaminophen-codeine) .... One to two tabs three times a day as needed for cough suppression and aches. 8)  Prednisone 20 Mg Tabs (Prednisone) .... 2 tabs by mouth daily for 5 days 9)  Flonase 50 Mcg/act Susp (Fluticasone propionate) .... 2 sprays each nostril daily for 2 weeks dispense: 1 bottle 10)  Penicillin V Potassium 500 Mg Tabs (Penicillin v potassium) .Marland Kitchen.. 1 tab by mouth three times a day until seen by dentist 11)  Meloxicam 7.5 Mg Tabs (Meloxicam) .Marland Kitchen.. 1 tab by mouth daily for ankle pain 12)  Metronidazole 500 Mg Tabs (Metronidazole) .... 4 tabs by mouth x 1 dose  Patient Instructions: 1)  Just as the pharmacy to refill your mobic (also called meloxicam).  You should have lots of refills left. 2)  Use the hydrocodone just for breakthrough pain.  Try to limit it to no more than 1 a day. 3)  Stop the omeprazole acid pill and start Nexium instead.  Take one pill everyday.   4)  Call your dentist and try to find out when they will open again.  If it is going to be more than a couple weeks, try to find a different dentist.  If you can't, let us know and we can try to submit a referral but it can still be very difficult.  Prescriptions: PENICILLIN V POTASSIUM 500 MG TABS (PENICILLIN V POTASSIUM) 1 tab by  mouth three times a day until seen by dentist  #45 x 0   Entered and Authorized by:   Ardeen Garland  MD   Signed by:   Ardeen Garland  MD on 12/26/2009   Method used:   Print then Give to Patient   RxID:   [id number] HYDROCODONE-ACETAMINOPHEN 7.5-500 MG TABS (HYDROCODONE-ACETAMINOPHEN) 1 tab by mouth q 4-6 hrs as needed pain  #90 x 3   Entered and Authorized by:   Ardeen Garland  MD  Signed by:   Ardeen Garland  MD on 12/26/2009   Method used:   Print then Give to Patient   RxID:   [id number] NEXIUM 40 MG CPDR (ESOMEPRAZOLE MAGNESIUM) 1 tab by mouth daily for acid reflux  #33 x 11   Entered and Authorized by:   Ardeen Garland  MD   Signed by:   Ardeen Garland  MD on 12/26/2009   Method used:   Print then Give to Patient   RxID:   [id number]

## 2011-01-07 NOTE — Miscellaneous (Signed)
Summary: Pap update  Clinical Lists Changes  Observations: Added new observation of MAMMO DUE: Not Indicated (09/11/2010 10:27) Added new observation of PAP DUE: 12/20/2010 (09/11/2010 10:27) Added new observation of FLUVAXDUE: 10/22/2010 (09/11/2010 10:27)     Last PAP:  NEGATIVE FOR INTRAEPITHELIAL LESIONS OR MALIGNANCY. (12/20/2008 12:00:00 AM) PAP Next Due:  2 yr Last Mammogram:  ASSESSMENT: Negative - BI-RADS 1^MM DIGITAL SCREENING (01/31/2009 8:44:00 AM) Mammogram Next Due:  Not Indicated

## 2011-01-08 ENCOUNTER — Ambulatory Visit: Admit: 2011-01-08 | Payer: Self-pay

## 2011-01-08 ENCOUNTER — Encounter: Payer: Self-pay | Admitting: Family Medicine

## 2011-01-25 ENCOUNTER — Telehealth: Payer: Self-pay | Admitting: *Deleted

## 2011-01-25 NOTE — Telephone Encounter (Signed)
Pt called front desk to ask for refill on nexium. States she has asked pharmacy to send it here & she is out. In distress & stated she will go to ED if this does not get filled. pcp not available. Sent to preceptor

## 2011-01-25 NOTE — Telephone Encounter (Signed)
Pt called needing refill on hctz & nexium. Called to her pharmacy with 3 rf per Dr. Deirdre Priest

## 2011-03-16 ENCOUNTER — Encounter: Payer: Self-pay | Admitting: Family Medicine

## 2011-03-16 ENCOUNTER — Other Ambulatory Visit: Payer: Self-pay | Admitting: Family Medicine

## 2011-03-16 MED ORDER — HYDROCHLOROTHIAZIDE 25 MG PO TABS: 25.0000 mg | ORAL_TABLET | Freq: Every day | ORAL | Status: DC

## 2011-03-16 NOTE — Telephone Encounter (Signed)
This encounter was created in error - please disregard.

## 2011-03-16 NOTE — Telephone Encounter (Signed)
Patient in [place] and out of meds.  Will fax.

## 2011-04-22 ENCOUNTER — Telehealth: Payer: Self-pay | Admitting: Family Medicine

## 2011-04-22 NOTE — Telephone Encounter (Signed)
Called and left message for patient to return call, see message below.[name], Regina Leach

## 2011-04-22 NOTE — Telephone Encounter (Signed)
Pt asking to speak with RN, is taking something for acid reflux over the counter but it has caused hives, pt successfully used nexium in the past & is asking if we can send in a new rx to a pharmacy she is using while out of town. Pt also was asking about getting an inhaler, says this should be on her med list. Wants to speak with RN first.

## 2011-06-03 ENCOUNTER — Other Ambulatory Visit: Payer: Self-pay | Admitting: Family Medicine

## 2011-06-03 DIAGNOSIS — I10 Essential (primary) hypertension: Secondary | ICD-10-CM

## 2011-06-03 DIAGNOSIS — K219 Gastro-esophageal reflux disease without esophagitis: Secondary | ICD-10-CM

## 2011-06-03 NOTE — Telephone Encounter (Signed)
Needs refill for Blood Pressure medicine and Acid Reflux medication sent to [facility] in [place] on [address].

## 2011-06-03 NOTE — Telephone Encounter (Signed)
Forward to PCP for refill

## 2011-06-05 NOTE — Telephone Encounter (Signed)
I have yet to have her keep an appt with me.  She was given 3 months worth of refills in Feb when she called in in distress and I believe at that point was told she needed to come in, which she never did.  Please find out how long she will be in [place] and I will give her enough medicine to get her back to [place] at which point she needs an appt with me ASAP, as in within 2 weeks of coming back. If she will be in [place] for >2 months from now, please inform her she will need to find an MD out there for these requests.  Thanks!

## 2011-06-05 NOTE — Telephone Encounter (Signed)
In addition, pt called in [facility] with the same request (out in [place], out of meds). Likely she needs to find a provider out there.

## 2011-06-07 ENCOUNTER — Other Ambulatory Visit: Payer: Self-pay | Admitting: Family Medicine

## 2011-06-07 NOTE — Telephone Encounter (Signed)
Called patient she will be back in 1 month, needs meds sent to [facility] on [address] in [place]. Said she will schedule OV with PCP when she returns.[name], Rodena Medin

## 2011-06-07 NOTE — Telephone Encounter (Signed)
Forward to [place]. 

## 2011-06-07 NOTE — Telephone Encounter (Signed)
Refill request

## 2011-06-08 MED ORDER — HYDROCHLOROTHIAZIDE 25 MG PO TABS: 25.0000 mg | ORAL_TABLET | Freq: Every day | ORAL | Status: DC

## 2011-06-08 MED ORDER — ESOMEPRAZOLE MAGNESIUM 40 MG PO CPDR: 40.0000 mg | DELAYED_RELEASE_CAPSULE | Freq: Every day | ORAL | Status: DC

## 2011-06-08 NOTE — Telephone Encounter (Signed)
Ok, refill sent.  I am giving enough for 45 days. NO MORE REFILLS until pt is seen by me.  Thanks!

## 2011-06-08 NOTE — Telephone Encounter (Signed)
Addended by: Demetria Pore A on: 06/08/2011 02:41 PM   Modules accepted: Orders

## 2011-06-10 ENCOUNTER — Telehealth: Payer: Self-pay | Admitting: *Deleted

## 2011-06-10 NOTE — Telephone Encounter (Signed)
PA required for nexium. Form placed in MD box 

## 2011-06-11 NOTE — Telephone Encounter (Signed)
Okay, just registered for it.  Will this let my script go through now?

## 2011-06-11 NOTE — Telephone Encounter (Signed)
I looked on the medicaid list and it looks like nexium is on their preferred list-- is there a reason it needs a PA? Or was it just b/c I was giving her 45 tab (6 weeks worth of meds)? Thanks!

## 2011-06-11 NOTE — Telephone Encounter (Signed)
Thank you so much for all of your help, I really appreciate it.  If Regina Leach calls back, just have her take OTC protonix or prilosec or really any PPI or H2-blocker from over the counter.  She should take it as the directions on the box or bottle state and then we will explore options once she comes to see me.  Thanks!!!

## 2011-06-11 NOTE — Telephone Encounter (Signed)
recieved notice from [employer] that although medication is on the preferred list because it is a brand name as of 05/11/2011 when a brand name medication is requested prescriber must register at [email or web address]  . Call [phone] if help is needed with website.     Notification placed in MD box.

## 2011-06-11 NOTE — Telephone Encounter (Signed)
Called pharmacy in [place] that requested PA to advise them that we have done everything from  our end. Pharmacist states Regina Leach is requesting the PA. Called [place] medicaid and was told that her eligibility for  medicaid ended on 04/05/2011. Advised pharmacist in [place] that patient will need to have a doctor in [place] to prescribe as we have no idea  of medicaid number or who to contact for her medicaid plus probably we do not participate with [employer] . Tried to call patient but phone number listed belongs to another person now.

## 2012-01-07 ENCOUNTER — Other Ambulatory Visit: Payer: Self-pay | Admitting: Family Medicine

## 2013-08-23 ENCOUNTER — Encounter: Payer: Self-pay | Admitting: Family Medicine

## 2013-08-23 ENCOUNTER — Ambulatory Visit (INDEPENDENT_AMBULATORY_CARE_PROVIDER_SITE_OTHER): Payer: Medicaid Other | Admitting: Family Medicine

## 2013-08-23 ENCOUNTER — Telehealth: Payer: Self-pay | Admitting: Family Medicine

## 2013-08-23 VITALS — BP 146/85 | HR 87 | Temp 98.5°F | Ht 62.0 in | Wt 213.0 lb

## 2013-08-23 DIAGNOSIS — E669 Obesity, unspecified: Secondary | ICD-10-CM

## 2013-08-23 DIAGNOSIS — M25579 Pain in unspecified ankle and joints of unspecified foot: Secondary | ICD-10-CM

## 2013-08-23 DIAGNOSIS — K219 Gastro-esophageal reflux disease without esophagitis: Secondary | ICD-10-CM

## 2013-08-23 DIAGNOSIS — J45909 Unspecified asthma, uncomplicated: Secondary | ICD-10-CM

## 2013-08-23 DIAGNOSIS — F17200 Nicotine dependence, unspecified, uncomplicated: Secondary | ICD-10-CM

## 2013-08-23 DIAGNOSIS — Z23 Encounter for immunization: Secondary | ICD-10-CM

## 2013-08-23 DIAGNOSIS — F39 Unspecified mood [affective] disorder: Secondary | ICD-10-CM

## 2013-08-23 DIAGNOSIS — I10 Essential (primary) hypertension: Secondary | ICD-10-CM

## 2013-08-23 MED ORDER — ESOMEPRAZOLE MAGNESIUM 40 MG PO CPDR: 40.0000 mg | DELAYED_RELEASE_CAPSULE | Freq: Every day | ORAL | Status: DC

## 2013-08-23 MED ORDER — NICOTINE 14 MG/24HR TD PT24: 1.0000 | MEDICATED_PATCH | TRANSDERMAL | Status: DC

## 2013-08-23 MED ORDER — HYDROCHLOROTHIAZIDE 25 MG PO TABS: 25.0000 mg | ORAL_TABLET | Freq: Every day | ORAL | Status: DC

## 2013-08-23 NOTE — Patient Instructions (Addendum)
It was nice meeting you.  I have prescribed Nexium for your GERD and HCTZ for your HTN.  In regards to your smoking cessation, I am prescribing nicotine patches (1 patch daily).  I am not using the other medications because you are on the Seroquel.  I want you to follow up with our pharmacist Dr. Raymondo Band who specializes in smoking cessation.    You can return on any day other than Thursday for your TB test.   Please follow up with me in ~ 1 month or earlier if needed.

## 2013-08-23 NOTE — Assessment & Plan Note (Signed)
Discussed weight loss with patient today. Advise regular exercise - walking 30 minutes a day most days of the week. I also discussed healthy foods with the patient advised increase consumption of fruits, vegetables, and lean meats. Patient amenable to changing her diet and lifestyle.  Will followup.

## 2013-08-23 NOTE — Progress Notes (Signed)
Subjective:     Patient ID: Regina Leach, female   DOB: 1966/11/30, 47 y.o.   MRN: [id number]  HPI 47 year old female presents to the clinic today to re-establish care as she has recently relocated from [place]. She has several things she like to discuss today.  1) HTN Disease Monitoring: Home BP Monitoring: No Chest pain- No   Dyspnea- Yes, which she attributes to tobacco abuse.  Medications: Compliance-  Yes, however she has recently been out of her medication Lightheadedness-  No  Edema- No   2) GERD - Patient reports long-standing history of gastroesophageal reflux disease. - She states that this is well controlled on Nexium. - No recent dysphagia or exacerbation of her symptoms. - She's requesting refill on Nexium today.  3) Smoking cessation - Patient long-standing history of tobacco abuse.  She states that she smokes approximately 10 cigarettes a day.  - She states she is ready to quit smoking and she knows that it is not good for her health. - She's requesting medication/assistance to help her quit smoking.  4) Weight loss - Patient is on so-so her weight is recently reported weight gain. - She like to discuss weight loss today. - She states that she is recently started walking on regular basis and is attempting to cut down on fatty and fried foods.   Social Hx - Patient continues to smoke.  Review of Systems Per HPI    Objective:   Physical Exam Filed Vitals:   08/23/13 0924  BP: 146/85  Pulse: 87  Temp: 98.5 F (36.9 C)   Exam: General: well appearing, NAD. Cardiovascular: RRR. 2/6 systolic murmur noted.  Respiratory: CTAB. No rales, rhonchi, or wheeze. Abdomen: soft, nontender, nondistended    Assessment:     See problem list    Plan:

## 2013-08-23 NOTE — Telephone Encounter (Signed)
Pt is requesting iron pills called in for her to the pharmacy. JW

## 2013-08-23 NOTE — Telephone Encounter (Signed)
Will forward to PCP 

## 2013-08-23 NOTE — Assessment & Plan Note (Signed)
Well-controlled on Nexium. Nexium refilled today.

## 2013-08-23 NOTE — Assessment & Plan Note (Signed)
Uncontrolled at this time as patient has been out of her medication. I refilled the patient's HCTZ today. Patient may need additional agents for blood pressure remains uncontrolled.

## 2013-08-23 NOTE — Addendum Note (Signed)
Addended by: Tommie Sams on: 08/23/2013 12:29 PM   Modules accepted: Orders, Medications

## 2013-08-23 NOTE — Assessment & Plan Note (Signed)
Given history of mood disorder, we'll proceed with nicotine replacement.  Patient started on 14 mg nicotine patch. Patient to followup with Dr. Raymondo Band will and myself in the next 2 weeks to one month.

## 2013-08-24 NOTE — Telephone Encounter (Signed)
There is no CBC in epic to suggest underlying anemia.   If patient needs Iron will need to follow up in the clinic for CBC/Iron studies.

## 2013-08-30 ENCOUNTER — Ambulatory Visit (INDEPENDENT_AMBULATORY_CARE_PROVIDER_SITE_OTHER): Payer: Medicaid Other | Admitting: Family Medicine

## 2013-08-30 ENCOUNTER — Encounter: Payer: Self-pay | Admitting: Family Medicine

## 2013-08-30 VITALS — BP 138/86 | HR 60 | Temp 98.0°F | Ht 62.0 in | Wt 209.0 lb

## 2013-08-30 DIAGNOSIS — D509 Iron deficiency anemia, unspecified: Secondary | ICD-10-CM | POA: Insufficient documentation

## 2013-08-30 DIAGNOSIS — M25579 Pain in unspecified ankle and joints of unspecified foot: Secondary | ICD-10-CM

## 2013-08-30 DIAGNOSIS — M25571 Pain in right ankle and joints of right foot: Secondary | ICD-10-CM

## 2013-08-30 DIAGNOSIS — D649 Anemia, unspecified: Secondary | ICD-10-CM

## 2013-08-30 DIAGNOSIS — F17200 Nicotine dependence, unspecified, uncomplicated: Secondary | ICD-10-CM

## 2013-08-30 LAB — CBC
HCT: 35.7 % — ABNORMAL LOW (ref 36.0–46.0)
Hemoglobin: 12.1 g/dL (ref 12.0–15.0)
MCH: 28.7 pg (ref 26.0–34.0)
MCHC: 33.9 g/dL (ref 30.0–36.0)
MCV: 84.8 fL (ref 78.0–100.0)
RBC: 4.21 MIL/uL (ref 3.87–5.11)
WBC: 6.6 10*3/uL (ref 4.0–10.5)

## 2013-08-30 MED ORDER — PREGABALIN 75 MG PO CAPS: 75.0000 mg | ORAL_CAPSULE | Freq: Two times a day (BID) | ORAL | Status: DC

## 2013-08-30 MED ORDER — ALBUTEROL SULFATE HFA 108 (90 BASE) MCG/ACT IN AERS: 2.0000 | INHALATION_SPRAY | RESPIRATORY_TRACT | Status: DC | PRN

## 2013-08-30 MED ORDER — ALBUTEROL SULFATE HFA 108 (90 BASE) MCG/ACT IN AERS: 2.0000 | INHALATION_SPRAY | Freq: Four times a day (QID) | RESPIRATORY_TRACT | Status: DC | PRN

## 2013-08-30 MED ORDER — LANSOPRAZOLE 15 MG PO CPDR: 15.0000 mg | DELAYED_RELEASE_CAPSULE | Freq: Every day | ORAL | Status: DC

## 2013-08-30 NOTE — Assessment & Plan Note (Signed)
No record of this in the medical record. Will obtain CBC, Iron/Ferritin/TIBC to evaluate for anemia.

## 2013-08-30 NOTE — Patient Instructions (Addendum)
It was nice seeing you today.  I am checking labs and an xray of your ankle and foot.  I have refilled your Albuterol inhaler.    I am prescribing you Lyrica for your pain.  You can take daily tylenol, OTC nsaids (ibuprofen, Aleve), and use heat and topical agents (bengay, capsicin) for your pain as well.  Follow up in 1 month regarding your pain.   Congratulations on your smoking cessation.

## 2013-08-30 NOTE — Assessment & Plan Note (Signed)
Patient informed me that she is using the Nicotine patches and has not smoked in 3 days. Patient congratulated on her success.

## 2013-08-30 NOTE — Assessment & Plan Note (Signed)
Given physical exam and history, I do not feel that chronic narcotics are indicated. I discuss this with the patient and emphasized the risks of chronic narcotics.  I also informed her that I don't think it is indicated. I discuss alternative options with the patient and she reluctantly agreed. Will obtain xray to further evaluate ankle/foot and to assess for worsening.  Will try a trial of Lyrica for her pain. Advised use of tylenol, NSAID's, and OTC topical agents as well.

## 2013-08-30 NOTE — Progress Notes (Signed)
Subjective:     Patient ID: Regina Leach, female   DOB: 1966/02/18, 47 y.o.   MRN: [id number]  HPI 47 year old female who recently established with me presents for follow up. Her concerns today are below:  1) Anemia - Patient reports a history of iron deficiency anemia. - Given taking iron basis and is now out. - She is requesting refill her iron today. - She reports that she's feeling well.  No complaints of fatigue currently.  2) Right Ankle pain - Patient reports that she fractured her ankle approximately 8-9 years ago. - Since that time she's had chronic pain.  She has tried physical therapy and NSAID's with no improvement.   - She was subsequently placed on Vicodin. - She states she's been out of medication for approximately 3 weeks.  She is requesting refill today. - When asked about alternative treatments, she says nothing else works. - Pain moderate in severity and interferes with exercise.  Pain worsens with changes in the weather.   Review of Systems Per HPI    Objective:   Physical Exam Filed Vitals:   08/30/13 0920  BP: 138/86  Pulse: 60  Temp: 98 F (36.7 C)  Exam: General: well appearing, well developed, NAD. HEENT: NCAT.  Cardiovascular: RRR. 2/6 systolic murmur auscultated. Respiratory: CTAB. No rales, rhonchi, or wheeze. Abdomen: soft, nontender, nondistended. Extremities: warm, well perfused.  MSK: Right ankle: tender to palpation diffusely.  Normal ROM.  No redness or effusion noted.     Assessment:  See Problem List    Plan:

## 2013-08-31 ENCOUNTER — Other Ambulatory Visit: Payer: Self-pay | Admitting: Family Medicine

## 2013-08-31 ENCOUNTER — Ambulatory Visit (INDEPENDENT_AMBULATORY_CARE_PROVIDER_SITE_OTHER): Payer: Medicaid Other | Admitting: *Deleted

## 2013-08-31 DIAGNOSIS — Z111 Encounter for screening for respiratory tuberculosis: Secondary | ICD-10-CM

## 2013-08-31 LAB — IBC PANEL
TIBC: 550 ug/dL — ABNORMAL HIGH (ref 250–470)
UIBC: 511 ug/dL — ABNORMAL HIGH (ref 125–400)

## 2013-08-31 LAB — FERRITIN: Ferritin: 12 ng/mL (ref 10–291)

## 2013-08-31 LAB — IRON: Iron: 39 ug/dL — ABNORMAL LOW (ref 42–145)

## 2013-08-31 MED ORDER — PANTOPRAZOLE SODIUM 40 MG PO TBEC: 40.0000 mg | DELAYED_RELEASE_TABLET | Freq: Every day | ORAL | Status: DC

## 2013-08-31 NOTE — Progress Notes (Signed)
Tuberculin skin test applied to left ventral forearm.   Return appt scheduled with nurse to have site read in 48-72 hours.  Marqus Macphee Ann, RN  

## 2013-09-03 ENCOUNTER — Ambulatory Visit (INDEPENDENT_AMBULATORY_CARE_PROVIDER_SITE_OTHER): Payer: Medicaid Other | Admitting: *Deleted

## 2013-09-03 ENCOUNTER — Encounter: Payer: Self-pay | Admitting: *Deleted

## 2013-09-03 DIAGNOSIS — Z111 Encounter for screening for respiratory tuberculosis: Secondary | ICD-10-CM

## 2013-09-03 LAB — TB SKIN TEST
Induration: 0 mm
TB Skin Test: NEGATIVE

## 2013-09-14 ENCOUNTER — Other Ambulatory Visit: Payer: Self-pay | Admitting: Family Medicine

## 2013-09-18 ENCOUNTER — Telehealth: Payer: Self-pay | Admitting: Family Medicine

## 2013-09-18 NOTE — Telephone Encounter (Signed)
Patient would like her results from OV 08/30/13. Please inform patient.

## 2013-12-19 ENCOUNTER — Ambulatory Visit: Payer: Medicaid Other | Admitting: Family Medicine

## 2013-12-25 ENCOUNTER — Encounter: Payer: Self-pay | Admitting: Family Medicine

## 2013-12-25 ENCOUNTER — Ambulatory Visit (INDEPENDENT_AMBULATORY_CARE_PROVIDER_SITE_OTHER): Payer: Medicaid Other | Admitting: Family Medicine

## 2013-12-25 ENCOUNTER — Other Ambulatory Visit (HOSPITAL_COMMUNITY)
Admission: RE | Admit: 2013-12-25 | Discharge: 2013-12-25 | Disposition: A | Discharge status: Home / Self Care | Payer: Medicaid Other | Source: Ambulatory Visit | Attending: Family Medicine | Admitting: Family Medicine

## 2013-12-25 VITALS — BP 121/66 | HR 111 | Temp 98.8°F | Ht 62.0 in | Wt 212.0 lb

## 2013-12-25 DIAGNOSIS — Z205 Contact with and (suspected) exposure to viral hepatitis: Secondary | ICD-10-CM | POA: Insufficient documentation

## 2013-12-25 DIAGNOSIS — Z20828 Contact with and (suspected) exposure to other viral communicable diseases: Secondary | ICD-10-CM

## 2013-12-25 DIAGNOSIS — Z113 Encounter for screening for infections with a predominantly sexual mode of transmission: Secondary | ICD-10-CM | POA: Insufficient documentation

## 2013-12-25 DIAGNOSIS — Z202 Contact with and (suspected) exposure to infections with a predominantly sexual mode of transmission: Secondary | ICD-10-CM

## 2013-12-25 NOTE — Progress Notes (Signed)
Subjective:     Patient ID: Regina Leach, female   DOB: 06/30/1966, 48 y.o.   MRN: [id number]  HPI 48 year old female with PMH of HTN, GERD, and Tobacco abuse presents with concerns of HCV exposure.  1) HCV exposure - Patient's husband was recently released from prison.  During prison, he got a tattoo and was later found to have Hep. C. - On prior visit with husband, Regina Leach inquired about transmission.  I informed her that HCV can be transmitted through intercourse, although this is very low. - She would like testing today. - She would also like GC/Chlamydia, Syphilis, and HIV screening today as well. - She has no complaints currently and states that she is doing well.  She denies abdominal pain, loss of appetite, weight loss.   Review of Systems Per HPI    Objective:   Physical Exam Exam: General: well appearing obese female in NAD.  HEENT: No scleral icterus. Cardiovascular: Tachycardic. No murmurs, rubs, or gallops. Respiratory: CTAB. No rales, rhonchi, or wheeze. Abdomen: soft, nontender, nondistended. No organomegaly appreciated. Extremities: No LE edema.    Assessment/Plan:  See Problem List

## 2013-12-25 NOTE — Patient Instructions (Signed)
Congratulations on quitting smoking!  We will get the labs today.  I (or my staff) will call you with the results.  If you have any troubles with your mood (i.e. It impacts your ability to function at home or work) let me know.

## 2013-12-25 NOTE — Assessment & Plan Note (Signed)
Obtaining HCV antibody with reflex. Also obtaining GC/Chlamydia/RPR/HIV.

## 2013-12-26 LAB — HEPATITIS C ANTIBODY, REFLEX: HCV Ab: NEGATIVE

## 2013-12-26 LAB — HIV ANTIBODY (ROUTINE TESTING W REFLEX): HIV: NONREACTIVE

## 2013-12-26 LAB — RPR

## 2013-12-27 ENCOUNTER — Telehealth: Payer: Self-pay | Admitting: *Deleted

## 2013-12-27 NOTE — Telephone Encounter (Signed)
Related message,patient voiced understanding. Regina Leach  

## 2013-12-27 NOTE — Telephone Encounter (Signed)
Message copied by Tanna SavoyPROPOSITO, Sadae Arrazola S on Thu Dec 27, 2013 11:13 AM ------      Message from: Tommie SamsOOK, JAYCE G      Created: Wed Dec 26, 2013  5:11 PM       Please call and inform patient of negative HIV, Syphilis and Hep C.             Thanks            Jayce ------

## 2014-03-26 ENCOUNTER — Telehealth: Payer: Self-pay | Admitting: Family Medicine

## 2014-03-26 MED ORDER — PANTOPRAZOLE SODIUM 40 MG PO TBEC: 40.0000 mg | DELAYED_RELEASE_TABLET | Freq: Every day | ORAL | Status: DC

## 2014-03-26 NOTE — Telephone Encounter (Signed)
Refill request for Protonix 

## 2014-03-26 NOTE — Telephone Encounter (Signed)
Rx sent 

## 2014-04-08 ENCOUNTER — Ambulatory Visit (INDEPENDENT_AMBULATORY_CARE_PROVIDER_SITE_OTHER): Payer: Medicaid Other | Admitting: Family Medicine

## 2014-04-08 ENCOUNTER — Encounter: Payer: Self-pay | Admitting: Family Medicine

## 2014-04-08 VITALS — BP 155/78 | HR 69 | Temp 98.9°F | Ht 62.0 in | Wt 217.1 lb

## 2014-04-08 DIAGNOSIS — K219 Gastro-esophageal reflux disease without esophagitis: Secondary | ICD-10-CM

## 2014-04-08 MED ORDER — OMEPRAZOLE 40 MG PO CPDR: 40.0000 mg | DELAYED_RELEASE_CAPSULE | Freq: Every day | ORAL | Status: DC

## 2014-04-08 MED ORDER — ALBUTEROL SULFATE HFA 108 (90 BASE) MCG/ACT IN AERS: 2.0000 | INHALATION_SPRAY | Freq: Four times a day (QID) | RESPIRATORY_TRACT | Status: DC | PRN

## 2014-04-08 NOTE — Assessment & Plan Note (Signed)
Patient reports that she has had a response to Nexium in the past. However, Medicaid is not sure this until she is failed to  prefer treatments.  Therefore, we're switching to omeprazole today.  If she fails this will change to Nexium.  No reflexes suggest underlying gastric/duodenal ulcer or malignant process.

## 2014-04-08 NOTE — Progress Notes (Signed)
   Subjective:    Patient ID: Regina Leach, female    DOB: 04/28/1966, 48 y.o.   MRN: [id number]  HPI 48 year old female presents for follow up.   Current concern(s):  1) GERD - Patient reports that she is continuing to have daily reflux despite PPI. - She reports that she has been increasing the dose of her protonix without significant relief. - She denies any abdominal pain, nausea, vomiting, weight loss, fevers chills.  - She is requesting a change in her medication today.  Review of Systems Per HPI    Objective:   Physical Exam Filed Vitals:   04/08/14 0849  BP: 155/78  Pulse: 69  Temp: 98.9 F (37.2 C)   Exam: General: well appearing female in NAD.  Pleasant and conver andsant.     Assessment & Plan:  See Problem List

## 2014-04-08 NOTE — Patient Instructions (Signed)
It was nice to see you today.  I am changing your medication for your reflux today.  Please let me know if you fail to improve.  Follow up annually or earlier if needed.

## 2014-06-01 ENCOUNTER — Encounter (HOSPITAL_COMMUNITY): Payer: Self-pay | Admitting: Emergency Medicine

## 2014-06-01 ENCOUNTER — Emergency Department (HOSPITAL_COMMUNITY)
Admission: EM | Admit: 2014-06-01 | Discharge: 2014-06-01 | Disposition: A | Discharge status: Home / Self Care | Payer: Medicaid Other | Source: Home / Self Care | Attending: Family Medicine | Admitting: Family Medicine

## 2014-06-01 DIAGNOSIS — J45901 Unspecified asthma with (acute) exacerbation: Secondary | ICD-10-CM

## 2014-06-01 HISTORY — DX: Unspecified asthma, uncomplicated: J45.909

## 2014-06-01 MED ORDER — METHYLPREDNISOLONE SODIUM SUCC 125 MG IJ SOLR
125.0000 mg | Freq: Once | INTRAMUSCULAR | Status: AC
  Administered 2014-06-01: 125 mg via INTRAMUSCULAR

## 2014-06-01 MED ORDER — METHYLPREDNISOLONE SODIUM SUCC 125 MG IJ SOLR
INTRAMUSCULAR | Status: AC
  Filled 2014-06-01: qty 2

## 2014-06-01 MED ORDER — IPRATROPIUM BROMIDE 0.02 % IN SOLN
0.5000 mg | Freq: Once | RESPIRATORY_TRACT | Status: AC
  Administered 2014-06-01: 0.5 mg via RESPIRATORY_TRACT

## 2014-06-01 MED ORDER — ALBUTEROL SULFATE (2.5 MG/3ML) 0.083% IN NEBU
INHALATION_SOLUTION | RESPIRATORY_TRACT | Status: AC
  Filled 2014-06-01: qty 6

## 2014-06-01 MED ORDER — ALBUTEROL SULFATE (2.5 MG/3ML) 0.083% IN NEBU
5.0000 mg | INHALATION_SOLUTION | Freq: Once | RESPIRATORY_TRACT | Status: AC
  Administered 2014-06-01: 5 mg via RESPIRATORY_TRACT

## 2014-06-01 MED ORDER — IPRATROPIUM BROMIDE 0.02 % IN SOLN
RESPIRATORY_TRACT | Status: AC
  Filled 2014-06-01: qty 2.5

## 2014-06-01 MED ORDER — LEVOFLOXACIN 500 MG PO TABS: 500.0000 mg | ORAL_TABLET | Freq: Every day | ORAL | Status: DC

## 2014-06-01 MED ORDER — ALBUTEROL SULFATE HFA 108 (90 BASE) MCG/ACT IN AERS: 2.0000 | INHALATION_SPRAY | Freq: Four times a day (QID) | RESPIRATORY_TRACT | Status: DC | PRN

## 2014-06-01 NOTE — ED Provider Notes (Signed)
CSN: [id number]     Arrival date & time 06/01/14  1844 History   First MD Initiated Contact with Patient 06/01/14 1854     Chief Complaint  Patient presents with  . Shortness of Breath   (Consider location/radiation/quality/duration/timing/severity/associated sxs/prior Treatment) Patient is a 48 y.o. female presenting with shortness of breath. The history is provided by the patient.  Shortness of Breath Severity:  Moderate Onset quality:  Gradual Duration:  3 days Progression:  Unchanged Chronicity:  Chronic Context: weather changes   Ineffective treatments:  Inhaler Associated symptoms: cough and wheezing   Associated symptoms: no fever   Risk factors: tobacco use     Past Medical History  Diagnosis Date  . Asthma    History reviewed. No pertinent past surgical history. History reviewed. No pertinent family history. History  Substance Use Topics  . Smoking status: Former Smoker -- 1.00 packs/day    Types: Cigarettes  . Smokeless tobacco: Not on file  . Alcohol Use: No   OB History   Grav Para Term Preterm Abortions TAB SAB Ect Mult Living                 Review of Systems  Constitutional: Negative.  Negative for fever.  Respiratory: Positive for cough, shortness of breath and wheezing.   Cardiovascular: Negative.     Allergies  Aspirin  Home Medications   Prior to Admission medications   Medication Sig Start Date End Date Taking? Authorizing Provider  albuterol (PROVENTIL HFA;VENTOLIN HFA) 108 (90 BASE) MCG/ACT inhaler Inhale 2 puffs into the lungs every 6 (six) hours as needed for wheezing or shortness of breath. 04/08/14  Yes Tommie SamsJayce G Cook, DO  albuterol (PROVENTIL HFA;VENTOLIN HFA) 108 (90 BASE) MCG/ACT inhaler Inhale 2 puffs into the lungs every 6 (six) hours as needed for wheezing or shortness of breath. 06/01/14   Linna HoffJames D Heather Mckendree, MD  hydrochlorothiazide (HYDRODIURIL) 25 MG tablet Take 1 tablet (25 mg total) by mouth daily. For high blood pressure 08/23/13    Tommie SamsJayce G Cook, DO  levofloxacin (LEVAQUIN) 500 MG tablet Take 1 tablet (500 mg total) by mouth daily. 06/01/14   Linna HoffJames D Quinnten Calvin, MD  omeprazole (PRILOSEC) 40 MG capsule Take 1 capsule (40 mg total) by mouth daily. 04/08/14   Tommie SamsJayce G Cook, DO  pantoprazole (PROTONIX) 40 MG tablet Take 1 tablet (40 mg total) by mouth daily. 03/26/14   Jayce G Cook, DO   BP 178/91  Pulse 95  Resp 20  SpO2 99%  LMP 05/02/2014 Physical Exam  Nursing note and vitals reviewed. Constitutional: She is oriented to person, place, and time. She appears well-developed and well-nourished. No distress.  HENT:  Right Ear: External ear normal.  Left Ear: External ear normal.  Mouth/Throat: Oropharynx is clear and moist.  Eyes: Conjunctivae are normal. Pupils are equal, round, and reactive to light.  Neck: Normal range of motion. Neck supple.  Cardiovascular: Regular rhythm and normal heart sounds.   Pulmonary/Chest: Effort normal. She has wheezes.  Lymphadenopathy:    She has no cervical adenopathy.  Neurological: She is alert and oriented to person, place, and time.  Skin: Skin is warm and dry.    ED Course  Procedures (including critical care time) Labs Review Labs Reviewed - No data to display  Imaging Review No results found.   MDM   1. Asthmatic bronchitis with acute exacerbation    Sx improved after neb medication at time of d/c.    Linna HoffJames D Somtochukwu Woollard, MD 06/01/14  1921 

## 2014-06-01 NOTE — ED Notes (Signed)
C/o  Sob since Thursday. Nonproductive cough.   Hx of asthma.  Having no relief with albuterol inhaler.

## 2014-06-01 NOTE — Discharge Instructions (Signed)
Take all of medicine, drink lots of fluids, no more smoking, see your doctor if further problems  °

## 2014-06-05 ENCOUNTER — Ambulatory Visit (INDEPENDENT_AMBULATORY_CARE_PROVIDER_SITE_OTHER): Payer: Medicaid Other | Admitting: Family Medicine

## 2014-06-05 ENCOUNTER — Encounter: Payer: Self-pay | Admitting: Family Medicine

## 2014-06-05 VITALS — BP 123/85 | HR 106 | Temp 98.9°F | Ht 62.0 in | Wt 212.0 lb

## 2014-06-05 DIAGNOSIS — J45901 Unspecified asthma with (acute) exacerbation: Secondary | ICD-10-CM

## 2014-06-05 DIAGNOSIS — J4521 Mild intermittent asthma with (acute) exacerbation: Secondary | ICD-10-CM

## 2014-06-05 DIAGNOSIS — N912 Amenorrhea, unspecified: Secondary | ICD-10-CM

## 2014-06-05 LAB — POCT URINE PREGNANCY: Preg Test, Ur: NEGATIVE

## 2014-06-05 MED ORDER — PREDNISONE 50 MG PO TABS: ORAL_TABLET | ORAL | Status: DC

## 2014-06-05 MED ORDER — BECLOMETHASONE DIPROPIONATE 80 MCG/ACT IN AERS: 2.0000 | INHALATION_SPRAY | Freq: Two times a day (BID) | RESPIRATORY_TRACT | Status: DC

## 2014-06-05 MED ORDER — HYDROCODONE-HOMATROPINE 5-1.5 MG/5ML PO SYRP: 5.0000 mL | ORAL_SOLUTION | Freq: Three times a day (TID) | ORAL | Status: DC | PRN

## 2014-06-05 NOTE — Progress Notes (Signed)
   Subjective:    Patient ID: Valentina GuJacqueline D Reither, female    DOB: 08/21/1966, 48 y.o.   MRN: [id number]  HPI: Pt presents to clinic for SDA for asthma exacerbation. She saw Dr. Artis FlockKindl at urgent care on 6/27 and was given a new inhaler, a steroid shot, and Rx for Levaquin, which she has been taking, but she is still having significant symptoms. She describes cough productive of whitish / yellow sputum, congestion, head and chest congestion, and shortness of breath. She is using albuterol heavily, more often than every 4 hours. She has had no fevers, chills, abdominal pain, or chest pain; she does have some soreness from her muscles hurting due to her cough. She does not take a regular antihistamine.  Pt is a former smoker. She requests a pregnancy test before she gets any medications, as her period has been late this month.  Review of Systems: As above.      Objective:   Physical Exam BP 123/85  Pulse 106  Temp(Src) 98.9 F (37.2 C) (Oral)  Ht 5\' 2"  (1.575 m)  Wt 212 lb (96.163 kg)  BMI 38.77 kg/m2  SpO2 93%  LMP 05/02/2014 Gen: well but uncomfortable-appearing adult female in NAD, coughing frequently HEENT: [place]/AT, EOMI, PERRLA, TM's clear bilaterally  Sclerae / conjunctivae clear, nasal and posterior pharyngeal mucosae clear  No cervical lymphadenopathy Pulm: scattered / diffuse frank expiratory wheezing with protracted expiratory phase  Clear with decent bilateral air movement and low-normal O2 sat despite wheezing Cardio: RRR, no murmur appreciated Ext: warm, well-perfused, no cyanosis / clubbing / edema  Urine pregnancy test: negative     Assessment & Plan:  A: Apparent asthma exacerbation +/- URI or bacterial respiratory infection (doubt frank pneumonia, however) - multifactorial - question infection, seasonal allergies, and possibly poor general asthma control; former smoker, so question component of COPD - on Levaquin and s/p steroid injection from recent urgent care visit,  with frequent over-use of albuterol despite steroid shot - pregnancy test negative  P: Continue Levaquin until completion, and add prednisone 50 mg for 5 days for burst therapy - add QVAR for long-term control improvement; if not effective would consider Advair or Dulera +/- Spiriva or Singulair for aggressive therapy - continue albuterol PRN but strongly advised pt not to use it more frequently than every 4 hours - Rx for Hycodan for cough and chest wall / rib pain to help with sleep; advised pt not to drive after taking this medication - suggested  - reviewed red flags that would prompt immediate return to care, including worse cough / wheezing / SOB, fever, N/V, etc - f/u with PCP Dr. Adriana Simasook in about 2 weeks for further med adjustment - f/u with PCP also for amenorrhea if irregular periods continue  Bobbye Mortonhristopher M Naylee Frankowski, MD PGY-2, [facility] 06/05/2014, 12:01 PM

## 2014-06-05 NOTE — Patient Instructions (Signed)
Thank you for coming in, today!  I think your symptoms are from your asthma and I want to treat them aggressively. You should use the new inhaler, QVAR, two puffs twice a day. This is an inhaled steroid "controller medication" that should help soothe your lungs. Keep taking it every day even after your current symptoms get better.  Make sure you finish your antibiotic. You can continue to use albuterol every 4 hours as needed. I am also prescribing 5 days of prednisone by mouth to help further reduce inflammation in your lungs.  Lastly, you can use Hycodan (hydrocodone + homatropine) to help with your cough, rib pain, and to help you sleep. Do NOT take this medicine and drive within 6 hours. This medicine may make you very sleepy. Try taking it only at night, to start with.  Come back to see Dr. Adriana Simasook in about 1 week, or sooner if you need. He will help figure out if you need to be on any different medicines. Please feel free to call with any questions or concerns at any time, at [phone]. --Dr. Casper HarrisonStreet

## 2014-06-25 ENCOUNTER — Telehealth: Payer: Self-pay | Admitting: Clinical

## 2014-06-25 NOTE — Telephone Encounter (Signed)
error 

## 2014-06-26 ENCOUNTER — Encounter: Payer: Medicaid Other | Admitting: Family Medicine

## 2014-07-03 ENCOUNTER — Other Ambulatory Visit (HOSPITAL_COMMUNITY)
Admission: RE | Admit: 2014-07-03 | Discharge: 2014-07-03 | Disposition: A | Discharge status: Home / Self Care | Payer: Medicaid Other | Source: Ambulatory Visit | Attending: Family Medicine | Admitting: Family Medicine

## 2014-07-03 ENCOUNTER — Ambulatory Visit (INDEPENDENT_AMBULATORY_CARE_PROVIDER_SITE_OTHER): Payer: Medicaid Other | Admitting: Family Medicine

## 2014-07-03 VITALS — BP 138/55 | HR 59 | Temp 98.6°F | Ht 62.0 in | Wt 218.0 lb

## 2014-07-03 DIAGNOSIS — Z87891 Personal history of nicotine dependence: Secondary | ICD-10-CM | POA: Diagnosis not present

## 2014-07-03 DIAGNOSIS — Z862 Personal history of diseases of the blood and blood-forming organs and certain disorders involving the immune mechanism: Secondary | ICD-10-CM | POA: Diagnosis not present

## 2014-07-03 DIAGNOSIS — Z113 Encounter for screening for infections with a predominantly sexual mode of transmission: Secondary | ICD-10-CM | POA: Diagnosis present

## 2014-07-03 DIAGNOSIS — F1011 Alcohol abuse, in remission: Secondary | ICD-10-CM

## 2014-07-03 DIAGNOSIS — J45909 Unspecified asthma, uncomplicated: Secondary | ICD-10-CM | POA: Diagnosis not present

## 2014-07-03 DIAGNOSIS — N898 Other specified noninflammatory disorders of vagina: Secondary | ICD-10-CM

## 2014-07-03 DIAGNOSIS — I10 Essential (primary) hypertension: Secondary | ICD-10-CM

## 2014-07-03 DIAGNOSIS — Z124 Encounter for screening for malignant neoplasm of cervix: Secondary | ICD-10-CM

## 2014-07-03 DIAGNOSIS — Z0000 Encounter for general adult medical examination without abnormal findings: Secondary | ICD-10-CM | POA: Insufficient documentation

## 2014-07-03 DIAGNOSIS — Z202 Contact with and (suspected) exposure to infections with a predominantly sexual mode of transmission: Secondary | ICD-10-CM | POA: Diagnosis not present

## 2014-07-03 DIAGNOSIS — Z01419 Encounter for gynecological examination (general) (routine) without abnormal findings: Secondary | ICD-10-CM | POA: Insufficient documentation

## 2014-07-03 DIAGNOSIS — Z1151 Encounter for screening for human papillomavirus (HPV): Secondary | ICD-10-CM | POA: Diagnosis present

## 2014-07-03 LAB — COMPLETE METABOLIC PANEL WITH GFR
ALT: 22 U/L (ref 0–35)
AST: 17 U/L (ref 0–37)
Albumin: 4 g/dL (ref 3.5–5.2)
Alkaline Phosphatase: 37 U/L — ABNORMAL LOW (ref 39–117)
BUN: 12 mg/dL (ref 6–23)
CALCIUM: 9.3 mg/dL (ref 8.4–10.5)
CHLORIDE: 103 meq/L (ref 96–112)
CO2: 26 mEq/L (ref 19–32)
CREATININE: 0.71 mg/dL (ref 0.50–1.10)
GFR, Est African American: >89 mL/min
GFR, Est Non African American: >89 mL/min
Glucose, Bld: 93 mg/dL (ref 70–99)
POTASSIUM: 4.5 meq/L (ref 3.5–5.3)
Sodium: 137 mEq/L (ref 135–145)
Total Bilirubin: 0.4 mg/dL (ref 0.2–1.2)
Total Protein: 6.6 g/dL (ref 6.0–8.3)

## 2014-07-03 LAB — CBC
HCT: 31.5 % — ABNORMAL LOW (ref 36.0–46.0)
Hemoglobin: 10.8 g/dL — ABNORMAL LOW (ref 12.0–15.0)
MCH: 27.5 pg (ref 26.0–34.0)
MCHC: 34.3 g/dL (ref 30.0–36.0)
MCV: 80.2 fL (ref 78.0–100.0)
PLATELETS: 401 10*3/uL — AB (ref 150–400)
RBC: 3.93 MIL/uL (ref 3.87–5.11)
RDW: 14.4 % (ref 11.5–15.5)
WBC: 4.9 10*3/uL (ref 4.0–10.5)

## 2014-07-03 LAB — POCT WET PREP (WET MOUNT)
CLUE CELLS WET PREP WHIFF POC: NEGATIVE
WBC, Wet Prep HPF POC: <5

## 2014-07-03 LAB — LIPID PANEL
Cholesterol: 213 mg/dL — ABNORMAL HIGH (ref 0–200)
HDL: 56 mg/dL (ref >39)
LDL Cholesterol: 133 mg/dL — ABNORMAL HIGH (ref 0–99)
TRIGLYCERIDES: 119 mg/dL (ref <150)
Total CHOL/HDL Ratio: 3.8 Ratio
VLDL: 24 mg/dL (ref 0–40)

## 2014-07-03 NOTE — Patient Instructions (Signed)
It was nice to see you today.  You are doing well.  Your physical exam was normal.  You will receive a letter in the mail regarding her lab results.  Please be sure to get your mammogram this year.  Follow up in ~3-6 months for influenza vaccine/BP check.

## 2014-07-03 NOTE — Progress Notes (Addendum)
   Subjective:    Patient ID: Regina Leach, female    DOB: 07/03/1966, 48 y.o.   MRN: [id number]  HPI 48 year old female with PMH of Asthma, GERD, HTN, Iron deficiency, and Tobacco abuse presents for annual physical exam/pap smear.  She states that she is doing well today.  She has quit smoking and has been abstinent for approximately one year.  She has also recently quit drinking alcohol and has been sober for approximately 2-3 months.  Current complaints/issues:  1) Vaginal discharge Onset: ~ 2-3 weeks ago. Description: White; thin. Associated symptoms: No associated itching, burning, odor, dysuria, pelvic pain, fever/chills.  2) Asthma  - Currently well controlled on QVAR and PRN Albuterol.  3) HTN Disease Monitoring: Home BP Monitoring - No  Medications:HCTZ Compliance -  Yes ROS: Denies chest pain, SOB, lightheadedness/dizziness  4) Pap smear/Gynecologic hx - Patient's last Pap smear was approximately one year ago but the results are unavailable to me at this time as it was [place] in [place].   - Patient recently reporting missed period, ~ 2 months ago.  She had normal menses this month.  Concerned about peri-menopause/menopause.  5) Screening  - Patient in need of mammogram this year.  Soc Hx - former smoker. See above.  Review of Systems  Constitutional: Negative for fever, chills and appetite change.  Respiratory: Negative for shortness of breath.   Cardiovascular: Negative for chest pain and palpitations.  Gastrointestinal: Negative for abdominal pain.  Genitourinary: Positive for vaginal discharge. Negative for dysuria.  Musculoskeletal: Negative.       Objective:   Physical Exam Filed Vitals:   07/03/14 0845  BP: 138/55  Pulse: 59  Temp: 98.6 F (37 C)  Exam: General: well appearing female, NAD. Cardiovascular: RRR. No murmurs, rubs, or gallops. Respiratory: CTAB. No rales, rhonchi, or wheeze. Abdomen: soft, nontender,  nondistended. Extremities: No LE edema. Skin: Warm, dry, intact.  Pelvic Exam:        External: normal female genitalia without lesions or masses        Vagina: white discharge in vault        Cervix: normal without lesions or masses        Pap smear: performed        Samples for Wet prep, GC/Chlamydia obtained    Assessment & Plan:  See Problem List

## 2014-07-03 NOTE — Assessment & Plan Note (Addendum)
Patient declined Tdap today. Given information about screening mammogram this year. Pap smear performed today.

## 2014-07-03 NOTE — Assessment & Plan Note (Signed)
Wet prep negative today. Patient reassured.

## 2014-07-03 NOTE — Assessment & Plan Note (Signed)
CBC today.  

## 2014-07-03 NOTE — Assessment & Plan Note (Signed)
Well controlled. Continue Qvar and PRN albuterol.

## 2014-07-03 NOTE — Assessment & Plan Note (Addendum)
At goal less than 140/90. Continue HCTZ.

## 2014-07-04 ENCOUNTER — Telehealth: Payer: Self-pay | Admitting: *Deleted

## 2014-07-04 ENCOUNTER — Encounter: Payer: Self-pay | Admitting: Family Medicine

## 2014-07-04 MED ORDER — FLUOCINONIDE 0.05 % EX OINT: 1.0000 "application " | TOPICAL_OINTMENT | Freq: Two times a day (BID) | CUTANEOUS | Status: DC

## 2014-07-04 NOTE — Telephone Encounter (Signed)
Rx sent 

## 2014-07-04 NOTE — Telephone Encounter (Signed)
Patient states she was seen yesterday and MD said he would prescribe the cream, fluocinonide, patient would like this send to her pharmacy. Will forward to PCP

## 2014-07-05 ENCOUNTER — Telehealth: Payer: Self-pay | Admitting: *Deleted

## 2014-07-05 NOTE — Telephone Encounter (Signed)
Spoke with patient and informed her of below results 

## 2014-07-05 NOTE — Telephone Encounter (Signed)
Message copied by Farrell OursEVANS, Travarus Trudo K on Fri Jul 05, 2014  9:21 AM ------      Message from: Tommie SamsOOK, JAYCE G      Created: Thu Jul 04, 2014  9:29 PM       Please inform patient of negative GC/Chlamydia.            Thank you            Everlene OtherJayce Cook ------

## 2014-07-08 ENCOUNTER — Other Ambulatory Visit: Payer: Self-pay

## 2014-07-08 DIAGNOSIS — Z1231 Encounter for screening mammogram for malignant neoplasm of breast: Secondary | ICD-10-CM

## 2014-07-08 LAB — CYTOLOGY - PAP

## 2014-07-09 ENCOUNTER — Telehealth: Payer: Self-pay | Admitting: *Deleted

## 2014-07-09 ENCOUNTER — Encounter: Payer: Self-pay | Admitting: *Deleted

## 2014-07-09 NOTE — Telephone Encounter (Signed)
Message copied by Tanna SavoyPROPOSITO, Silvia Markuson S on Tue Jul 09, 2014 10:08 AM ------      Message from: Tommie SamsOOK, JAYCE G      Created: Mon Jul 08, 2014  8:07 PM       Please inform patient of negative pap.            Thanks            United Technologies CorporationJayce Cook DO  ------

## 2014-07-09 NOTE — Telephone Encounter (Signed)
Relayed message,patients voiced understanding.Dontay Harm, Virgel BouquetGiovanna S

## 2014-07-09 NOTE — Progress Notes (Unsigned)
Prior Authorization received from [facility] pharmacy for Flucoinonide ointment. Formulary and PA form placed in provider box for completion. Cl[place]is PuMartin, Tamika L, RN

## 2014-07-10 ENCOUNTER — Other Ambulatory Visit: Payer: Self-pay | Admitting: Family Medicine

## 2014-07-10 MED ORDER — TRIAMCINOLONE ACETONIDE 0.1 % EX OINT: 1.0000 "application " | TOPICAL_OINTMENT | Freq: Two times a day (BID) | CUTANEOUS | Status: DC | PRN

## 2014-07-10 NOTE — Progress Notes (Signed)
Will switch to preferred drug.

## 2014-07-24 ENCOUNTER — Encounter: Payer: Self-pay | Admitting: Family Medicine

## 2014-07-24 ENCOUNTER — Ambulatory Visit
Admission: RE | Admit: 2014-07-24 | Discharge: 2014-07-24 | Disposition: A | Discharge status: Home / Self Care | Payer: Medicaid Other | Source: Ambulatory Visit

## 2014-07-24 DIAGNOSIS — Z1231 Encounter for screening mammogram for malignant neoplasm of breast: Secondary | ICD-10-CM

## 2014-08-09 ENCOUNTER — Telehealth: Payer: Self-pay | Admitting: Family Medicine

## 2014-08-09 DIAGNOSIS — I10 Essential (primary) hypertension: Secondary | ICD-10-CM

## 2014-08-09 MED ORDER — HYDROCHLOROTHIAZIDE 25 MG PO TABS: 25.0000 mg | ORAL_TABLET | Freq: Every day | ORAL | Status: DC

## 2014-08-09 NOTE — Telephone Encounter (Signed)
Rx sent 

## 2014-08-09 NOTE — Telephone Encounter (Signed)
Refill request for HCTZ. Please send RX to the [facility] on [address]

## 2014-09-30 ENCOUNTER — Ambulatory Visit: Payer: Medicaid Other

## 2014-10-01 ENCOUNTER — Other Ambulatory Visit: Payer: Self-pay | Admitting: Family Medicine

## 2014-10-01 DIAGNOSIS — I10 Essential (primary) hypertension: Secondary | ICD-10-CM

## 2014-10-01 NOTE — Telephone Encounter (Signed)
Pt called and needs a refill and her Acid reflux medication and also her BP medication called in to [facility] on [address] jw

## 2014-10-02 ENCOUNTER — Telehealth: Payer: Self-pay | Admitting: Family Medicine

## 2014-10-02 MED ORDER — PANTOPRAZOLE SODIUM 40 MG PO TBEC: 40.0000 mg | DELAYED_RELEASE_TABLET | Freq: Every day | ORAL | Status: DC

## 2014-10-02 MED ORDER — HYDROCHLOROTHIAZIDE 25 MG PO TABS: 25.0000 mg | ORAL_TABLET | Freq: Every day | ORAL | Status: DC

## 2014-10-02 NOTE — Telephone Encounter (Signed)
Pt called and said that Dr. Adriana Simasook sent in the wrong acid reflux medication. She said that she is taking a different one. She couldn't remember the name except that it begins with a M. jw

## 2014-10-03 MED ORDER — OMEPRAZOLE 40 MG PO CPDR: 40.0000 mg | DELAYED_RELEASE_CAPSULE | Freq: Every day | ORAL | Status: DC

## 2014-10-03 NOTE — Telephone Encounter (Signed)
Omeprazole sent in   

## 2014-10-09 ENCOUNTER — Ambulatory Visit (INDEPENDENT_AMBULATORY_CARE_PROVIDER_SITE_OTHER): Payer: Medicaid Other | Admitting: *Deleted

## 2014-10-09 DIAGNOSIS — Z23 Encounter for immunization: Secondary | ICD-10-CM

## 2014-10-09 DIAGNOSIS — Z111 Encounter for screening for respiratory tuberculosis: Secondary | ICD-10-CM

## 2014-10-11 ENCOUNTER — Ambulatory Visit: Payer: Medicaid Other | Admitting: *Deleted

## 2014-10-11 ENCOUNTER — Encounter: Payer: Self-pay | Admitting: *Deleted

## 2014-10-11 DIAGNOSIS — Z111 Encounter for screening for respiratory tuberculosis: Secondary | ICD-10-CM

## 2014-10-11 LAB — TB SKIN TEST
Induration: 0 mm
TB SKIN TEST: NEGATIVE

## 2014-10-11 NOTE — Progress Notes (Signed)
   PPD Reading Note PPD read and results entered in EpicCare. Result: 0 mm induration. Interpretation: Negative Martin, Tamika L, RN  

## 2014-10-22 ENCOUNTER — Telehealth: Payer: Self-pay | Admitting: *Deleted

## 2014-10-22 MED ORDER — NICOTINE 14 MG/24HR TD PT24: 14.0000 mg | MEDICATED_PATCH | Freq: Every day | TRANSDERMAL | Status: DC

## 2014-10-22 NOTE — Telephone Encounter (Signed)
Medication refill via fax for nicoderm cq 14mg /24hr patch

## 2014-10-22 NOTE — Telephone Encounter (Signed)
Rx refilled.

## 2015-03-26 ENCOUNTER — Encounter: Payer: Self-pay | Admitting: Family Medicine

## 2015-03-26 ENCOUNTER — Ambulatory Visit (INDEPENDENT_AMBULATORY_CARE_PROVIDER_SITE_OTHER): Payer: Medicaid Other | Admitting: Family Medicine

## 2015-03-26 VITALS — BP 122/76 | HR 79 | Temp 98.3°F | Ht 62.0 in | Wt 236.5 lb

## 2015-03-26 DIAGNOSIS — Z8619 Personal history of other infectious and parasitic diseases: Secondary | ICD-10-CM | POA: Diagnosis not present

## 2015-03-26 DIAGNOSIS — J449 Chronic obstructive pulmonary disease, unspecified: Secondary | ICD-10-CM | POA: Insufficient documentation

## 2015-03-26 DIAGNOSIS — K219 Gastro-esophageal reflux disease without esophagitis: Secondary | ICD-10-CM | POA: Diagnosis present

## 2015-03-26 DIAGNOSIS — I10 Essential (primary) hypertension: Secondary | ICD-10-CM

## 2015-03-26 DIAGNOSIS — E785 Hyperlipidemia, unspecified: Secondary | ICD-10-CM

## 2015-03-26 DIAGNOSIS — J441 Chronic obstructive pulmonary disease with (acute) exacerbation: Secondary | ICD-10-CM | POA: Diagnosis not present

## 2015-03-26 MED ORDER — PREDNISONE 50 MG PO TABS: 50.0000 mg | ORAL_TABLET | Freq: Every day | ORAL | Status: DC

## 2015-03-26 MED ORDER — DOXYCYCLINE HYCLATE 100 MG PO TABS: 100.0000 mg | ORAL_TABLET | Freq: Two times a day (BID) | ORAL | Status: DC

## 2015-03-26 MED ORDER — HYDROCODONE-HOMATROPINE 5-1.5 MG/5ML PO SYRP: 5.0000 mL | ORAL_SOLUTION | Freq: Three times a day (TID) | ORAL | Status: DC | PRN

## 2015-03-26 NOTE — Progress Notes (Signed)
   Subjective:    Patient ID: Regina Leach, female    DOB: 11/03/1966, 49 y.o.   MRN: [id number]  HPI 49 year old female with HTN, HLD, GERD and Asthma presents for follow up.  Currently experiencing productive cough.   1) Cough  Patient reports a two-week history of productive cough.  She's had little relief with albuterol treatment.  She's also tried several over-the-counter remedies with little improvement.  She reports recent chills and potentially fever although she is unsure about this.  She also reports shortness of breath with cough.  No recent sick contacts. No exacerbating factors.  She endorses compliance with albuterol and Qvar.  2) HTN  Well controlled.   Home BP Monitoring - no.  Medications - HCTZ 25 mg daily.  Compliance -  Yes.  ROS: Denies chest pain. SOB as above.  3) GERD  Patient reports her reflux is controlled with PPI.  She does however report some "butterflies in her stomach".  She states that this occurred several years ago and she was tested for a "bacteria in my stomach". She states that she was suddenly treated.  She is concerned that she is having recurrent infection and would like testing today.  No exacerbating factors.   4) HLD Lipid Panel     Component Value Date/Time   CHOL 213* 07/03/2014 0954   TRIG 119 07/03/2014 0954   HDL 56 07/03/2014 0954   CHOLHDL 3.8 07/03/2014 0954   VLDL 24 07/03/2014 0954   LDLCALC 133* 07/03/2014 0954    Hx of elevated lipids.  Not currently on treatment.   Social hx - former smoker.   Review of Systems  Constitutional: Positive for chills.       Possible subjective fever.   Respiratory: Positive for cough and shortness of breath.       Objective:   Physical Exam Filed Vitals:   03/26/15 0933  BP: 122/76  Pulse: 79  Temp: 98.3 F (36.8 C)   Exam: General: well appearing female in no acute distress. Significant coughing noted while in the room. Cardiovascular: RRR. No  murmurs, rubs, or gallops. Respiratory: Prolonged expiration phase and diffuse expiratory wheezing noted. Abdomen: soft, nontender, nondistended.    Assessment & Plan:  See Problem List

## 2015-03-26 NOTE — Patient Instructions (Signed)
It was nice to see you.  Take the medication as prescribed.  Follow up at least annually.  It was a pleasure taking care of you.  Take care  Dr. Adriana Simasook

## 2015-03-26 NOTE — Assessment & Plan Note (Signed)
Stable. Continue HCTZ. 

## 2015-03-26 NOTE — Assessment & Plan Note (Signed)
Patient with a long-standing history of tobacco abuse and also a prior diagnosis of asthma. Patient currently experiencing productive cough, mild shortness of breath, and has diffuse wheezing on exam. I suspect that the patient has underlying component of COPD although this is not been diagnosed yet. Given physical exam findings and presentation treating for COPD exacerbation with doxycycline and prednisone. I also gave patient Hycodan for cough. When a symptomatic would send to Dr. Raymondo BandKoval for spirometry.

## 2015-03-26 NOTE — Assessment & Plan Note (Signed)
Last LDL was 133. ASCVD 10 year risk score of 2.6%. Per 2013 guidelines, statin therapy is not indicated.  I discussed potential treatment if patient desired.  Patient states that she would like to continue monitoring with no treatment at this time.

## 2015-03-26 NOTE — Assessment & Plan Note (Signed)
Patient with a past medical history of GERD and recent uneasiness in her stomach consistent with prior episode of H. pylori per history. Advised patient to continue PPI.  Given history of H. pylori obtaining stool antigen to check for acute infection.

## 2015-03-28 ENCOUNTER — Encounter: Payer: Self-pay | Admitting: Family Medicine

## 2015-03-28 NOTE — Progress Notes (Signed)
I was preceptor for this visit.  Christopherjohn Schiele, MD 

## 2015-03-28 NOTE — Progress Notes (Signed)
Pt is leaving a specimen for Dr. Adriana Simasook; I am giving it to the lab / Dorothey BasemanSadie Reynolds, ASA

## 2015-03-28 NOTE — Addendum Note (Signed)
Addended by: Swazil[place]JORDAN, Kasson Lamere on: 03/28/2015 02:42 PM   Modules accepted: Orders

## 2015-03-29 ENCOUNTER — Encounter: Payer: Self-pay | Admitting: Family Medicine

## 2015-03-29 LAB — HELICOBACTER PYLORI  SPECIAL ANTIGEN: H. PYLORI ANTIGEN STOOL: NEGATIVE

## 2015-03-31 ENCOUNTER — Telehealth: Payer: Self-pay | Admitting: Family Medicine

## 2015-03-31 MED ORDER — ALBUTEROL SULFATE HFA 108 (90 BASE) MCG/ACT IN AERS: 2.0000 | INHALATION_SPRAY | Freq: Four times a day (QID) | RESPIRATORY_TRACT | Status: DC | PRN

## 2015-03-31 NOTE — Telephone Encounter (Signed)
Rx sent 

## 2015-03-31 NOTE — Telephone Encounter (Signed)
Pt called and needs a refill on her ProAir since this is the only one that helps. jw

## 2015-03-31 NOTE — Telephone Encounter (Signed)
Pt called and said that we called in the wrong Pro-Air. She needs Pro-Air HFA Albuterol Sulfate. jw

## 2015-04-09 ENCOUNTER — Other Ambulatory Visit: Payer: Self-pay | Admitting: *Deleted

## 2015-04-11 MED ORDER — BECLOMETHASONE DIPROPIONATE 80 MCG/ACT IN AERS: 2.0000 | INHALATION_SPRAY | Freq: Two times a day (BID) | RESPIRATORY_TRACT | Status: DC

## 2015-04-14 ENCOUNTER — Encounter: Payer: Self-pay | Admitting: Family Medicine

## 2015-04-14 ENCOUNTER — Ambulatory Visit (INDEPENDENT_AMBULATORY_CARE_PROVIDER_SITE_OTHER): Payer: Medicaid Other | Admitting: Family Medicine

## 2015-04-14 VITALS — BP 129/75 | HR 85 | Temp 98.4°F | Ht 62.0 in | Wt 234.5 lb

## 2015-04-14 DIAGNOSIS — F1010 Alcohol abuse, uncomplicated: Secondary | ICD-10-CM

## 2015-04-14 DIAGNOSIS — R0781 Pleurodynia: Secondary | ICD-10-CM | POA: Diagnosis not present

## 2015-04-14 DIAGNOSIS — J441 Chronic obstructive pulmonary disease with (acute) exacerbation: Secondary | ICD-10-CM | POA: Diagnosis not present

## 2015-04-14 DIAGNOSIS — F1011 Alcohol abuse, in remission: Secondary | ICD-10-CM

## 2015-04-14 DIAGNOSIS — R0602 Shortness of breath: Secondary | ICD-10-CM | POA: Diagnosis not present

## 2015-04-14 LAB — CBC
HEMATOCRIT: 31.8 % — AB (ref 36.0–46.0)
Hemoglobin: 10.6 g/dL — ABNORMAL LOW (ref 12.0–15.0)
MCH: 27.4 pg (ref 26.0–34.0)
MCHC: 33.3 g/dL (ref 30.0–36.0)
MCV: 82.2 fL (ref 78.0–100.0)
MPV: 8.6 fL (ref 8.6–12.4)
Platelets: 416 10*3/uL — ABNORMAL HIGH (ref 150–400)
RBC: 3.87 MIL/uL (ref 3.87–5.11)
RDW: 14.2 % (ref 11.5–15.5)
WBC: 8.3 10*3/uL (ref 4.0–10.5)

## 2015-04-14 MED ORDER — PREDNISONE 50 MG PO TABS: ORAL_TABLET | ORAL | Status: DC

## 2015-04-14 MED ORDER — ALBUTEROL SULFATE (2.5 MG/3ML) 0.083% IN NEBU: 2.5000 mg | INHALATION_SOLUTION | Freq: Four times a day (QID) | RESPIRATORY_TRACT | Status: DC | PRN

## 2015-04-14 NOTE — Progress Notes (Signed)
   Subjective:    Patient ID: Regina Leach, female    DOB: 09/01/1966, 49 y.o.   MRN: [id number]  HPI 49 year old female with past medical history of asthma and tobacco abuse presents for significant for with complaints of shortness of breath/wheezing.  1) SOB/Wheezing  Patient recently seen on 4/20.  Her treated her as a COPD exacerbation that time with doxycycline and prednisone.  She reports she had a little improvement with the above treatment.  She continues to experience significant for SOB and wheezing as well as cough.  She reports marked shortness of breath with exertion.  Patient also notes some orthopnea.  No LE edema or weight gain.   No recent fevers or chills. Cough is mildly productive. 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  No relieving factors.  2) R Lower rib pain  This is a new problem for the patient.    It started recently with continued cough/SOB.  Pain is moderate to severe and described as sharp.   No relieving factors.  Worse with activity & cough.  No recent fall, trauma, injury.  No interventions tried other than inhalers & cough syrup for cough/SOB.   Social Hx - Former smoker.   Review of Systems  Constitutional: Negative for fever and chills.  Respiratory: Positive for cough, shortness of breath and wheezing.   Musculoskeletal: Positive for back pain.      Objective:   Physical Exam Filed Vitals:   04/14/15 1031  BP: 129/75  Pulse: 85  Temp: 98.4 F (36.9 C)   Vital signs reviewed.  Exam: General: well appearing, NAD. Cardiovascular: RRR. No murmurs, rubs, or gallops. Respiratory: Diffuse expiratory wheezing. No rales appreciated.  Extremities: No LE edema.    Assessment & Plan:  See Problem List

## 2015-04-14 NOTE — Patient Instructions (Signed)
It was nice to see you today.  Use the albuterol every 4-6 hours as needed.  Take the prednisone as prescribed.  Follow up in ~ 1 week or sooner if not improving/worsening.

## 2015-04-15 ENCOUNTER — Telehealth: Payer: Self-pay | Admitting: *Deleted

## 2015-04-15 DIAGNOSIS — R0781 Pleurodynia: Secondary | ICD-10-CM | POA: Insufficient documentation

## 2015-04-15 LAB — PRO B NATRIURETIC PEPTIDE: PRO B NATRI PEPTIDE: 25.05 pg/mL (ref <126)

## 2015-04-15 MED ORDER — ALBUTEROL SULFATE (2.5 MG/3ML) 0.083% IN NEBU
2.5000 mg | INHALATION_SOLUTION | Freq: Once | RESPIRATORY_TRACT | Status: AC
  Administered 2015-04-14: 2.5 mg via RESPIRATORY_TRACT

## 2015-04-15 MED ORDER — IPRATROPIUM BROMIDE 0.02 % IN SOLN
0.5000 mg | Freq: Once | RESPIRATORY_TRACT | Status: AC
  Administered 2015-04-14: 0.5 mg via RESPIRATORY_TRACT

## 2015-04-15 NOTE — Assessment & Plan Note (Addendum)
Patient with continued symptoms and signs of an underlying obstructive lung disease exacerbation. It is surprising however that she did not have a more marked response to doxycycline and prednisone given previously. As a result I am obtaining a chest x-ray, CBC, and BNP. Patient given a DuoNeb in the office with improvement in her shortness of breath.  However, she continued to have significant wheezing. Started the patient on a longer taper prednisone. Patient was also given a nebulizer machine in the office and albuterol nebulizers person into her pharmacy (she seems to have better response to nebulizer instead of inhaler).  She will follow-up in approximately one week if she does not drastically improve.    ** Labs returned with normal BNP and mild anemia; no leukocytosis. I have not seen were patient got her chest x-ray.

## 2015-04-15 NOTE — Addendum Note (Signed)
Addended by: Lamonte SakaiZIMMERMAN RUMPLE, APRIL D on: 04/15/2015 08:39 AM   Modules accepted: Orders

## 2015-04-15 NOTE — Assessment & Plan Note (Signed)
Likely secondary to underlying COPD exacerbation given marked cough. Treating patient's underlying exacerbation. Will continue to follow.

## 2015-04-15 NOTE — Telephone Encounter (Signed)
-----   Message from Tommie SamsJayce G Cook, DO sent at 04/15/2015  8:09 AM EDT ----- Please inform patient of negative blood work, other than anemia. She should continue iron supplementation.  Additionally, patient should go get her chest xray.   Everlene OtherJayce Cook DO

## 2015-04-15 NOTE — Telephone Encounter (Signed)
Spoke with pt and informed her of below. Zimmerman Rumple, Kyel Purk D  

## 2015-04-16 ENCOUNTER — Ambulatory Visit
Admission: RE | Admit: 2015-04-16 | Discharge: 2015-04-16 | Disposition: A | Discharge status: Home / Self Care | Payer: Medicaid Other | Source: Ambulatory Visit | Attending: Family Medicine | Admitting: Family Medicine

## 2015-04-16 DIAGNOSIS — R0602 Shortness of breath: Secondary | ICD-10-CM

## 2015-04-17 ENCOUNTER — Telehealth: Payer: Self-pay | Admitting: *Deleted

## 2015-04-17 NOTE — Telephone Encounter (Signed)
-----   Message from Tommie SamsJayce G Cook, DO sent at 04/17/2015  8:25 AM EDT ----- Please inform of negative chest xray.

## 2015-04-17 NOTE — Telephone Encounter (Signed)
Informed pt of below. Zimmerman Rumple, Keshia Weare D, CMA  

## 2015-04-17 NOTE — Progress Notes (Signed)
I was preceptor for this office visit.  

## 2015-05-26 ENCOUNTER — Telehealth: Payer: Self-pay | Admitting: *Deleted

## 2015-05-26 MED ORDER — PANTOPRAZOLE SODIUM 40 MG PO TBEC: 40.0000 mg | DELAYED_RELEASE_TABLET | Freq: Every day | ORAL | Status: DC

## 2015-05-26 MED ORDER — OMEPRAZOLE 40 MG PO CPDR: 40.0000 mg | DELAYED_RELEASE_CAPSULE | Freq: Every day | ORAL | Status: DC

## 2015-05-26 NOTE — Telephone Encounter (Signed)
Refill request received from pharmacy for omeprazole.  This is not on pts med list, will forward to MD. Milas Gain, Maryjo Rochester

## 2015-05-26 NOTE — Addendum Note (Signed)
Addended by: Tommie Sams on: 05/26/2015 04:48 PM   Modules accepted: Orders, Medications

## 2015-05-26 NOTE — Telephone Encounter (Signed)
Regina Leach is calling to check on this request. Thank you, Dorothey Baseman, ASA

## 2015-05-26 NOTE — Telephone Encounter (Signed)
Rx sent 

## 2015-05-28 ENCOUNTER — Other Ambulatory Visit: Payer: Self-pay | Admitting: *Deleted

## 2015-05-28 MED ORDER — OMEPRAZOLE 40 MG PO CPDR: 40.0000 mg | DELAYED_RELEASE_CAPSULE | Freq: Every day | ORAL | Status: DC

## 2015-05-28 NOTE — Telephone Encounter (Signed)
Received refill request from pharmacy for pantoprazole.  Pharmacy states patient has been taking omeprazole 40 mg and that patient does not want pantoprazole.  Requesting to change back to omeprazole.  Will route request to Dr. Adriana Simas.  Altamese Dilling, BSN, RN-BC

## 2015-07-09 ENCOUNTER — Telehealth: Payer: Self-pay | Admitting: Family Medicine

## 2015-07-09 DIAGNOSIS — Z0000 Encounter for general adult medical examination without abnormal findings: Secondary | ICD-10-CM

## 2015-07-09 NOTE — Telephone Encounter (Signed)
Regina Leach need to have an order put in for her yearly mammogram.  Please contact her when appt has been made.  [facility]  Live down the street from office.

## 2015-07-09 NOTE — Telephone Encounter (Signed)
Order has been placed for mammogram.  Patient should be able to call breast center and make appt.  Typically, patient does not need order from PCP for screening mammogram and can just call breast center and make appt.  Erasmo Downer, MD, MPH PGY-2,  [facility] 07/09/2015 12:20 PM

## 2015-07-09 NOTE — Telephone Encounter (Signed)
Pt informed. Deseree Blount, CMA  

## 2015-07-11 ENCOUNTER — Other Ambulatory Visit: Payer: Self-pay

## 2015-07-11 DIAGNOSIS — Z1231 Encounter for screening mammogram for malignant neoplasm of breast: Secondary | ICD-10-CM

## 2015-07-28 ENCOUNTER — Ambulatory Visit
Admission: RE | Admit: 2015-07-28 | Discharge: 2015-07-28 | Disposition: A | Discharge status: Home / Self Care | Payer: Medicaid Other | Source: Ambulatory Visit

## 2015-07-28 DIAGNOSIS — Z1231 Encounter for screening mammogram for malignant neoplasm of breast: Secondary | ICD-10-CM

## 2015-07-29 ENCOUNTER — Encounter: Payer: Self-pay | Admitting: Family Medicine

## 2015-10-06 ENCOUNTER — Ambulatory Visit: Payer: Medicaid Other | Admitting: Family Medicine

## 2015-10-20 ENCOUNTER — Telehealth: Payer: Self-pay | Admitting: Family Medicine

## 2015-10-20 ENCOUNTER — Other Ambulatory Visit: Payer: Self-pay | Admitting: *Deleted

## 2015-10-20 DIAGNOSIS — I10 Essential (primary) hypertension: Secondary | ICD-10-CM

## 2015-10-20 MED ORDER — HYDROCHLOROTHIAZIDE 25 MG PO TABS: 25.0000 mg | ORAL_TABLET | Freq: Every day | ORAL | Status: DC

## 2015-10-20 NOTE — Telephone Encounter (Signed)
Refill request for HCTZ. Patient completely out.  

## 2015-10-20 NOTE — Telephone Encounter (Signed)
Refill sent  Erasmo DownerAngela M Bacigalupo, MD, MPH PGY-2,  [facility] 10/20/2015 9:59 AM

## 2015-10-21 ENCOUNTER — Ambulatory Visit: Payer: Medicaid Other | Admitting: Family Medicine

## 2015-10-22 ENCOUNTER — Ambulatory Visit: Payer: Medicaid Other | Admitting: Family Medicine

## 2015-11-17 ENCOUNTER — Encounter: Payer: Self-pay | Admitting: Family Medicine

## 2015-11-17 ENCOUNTER — Ambulatory Visit: Payer: Medicaid Other | Admitting: Family Medicine

## 2015-11-17 ENCOUNTER — Ambulatory Visit (INDEPENDENT_AMBULATORY_CARE_PROVIDER_SITE_OTHER): Payer: Medicaid Other | Admitting: Family Medicine

## 2015-11-17 VITALS — BP 124/63 | HR 92 | Temp 98.4°F | Ht 62.0 in | Wt 243.0 lb

## 2015-11-17 DIAGNOSIS — E785 Hyperlipidemia, unspecified: Secondary | ICD-10-CM | POA: Diagnosis not present

## 2015-11-17 DIAGNOSIS — I10 Essential (primary) hypertension: Secondary | ICD-10-CM | POA: Diagnosis not present

## 2015-11-17 DIAGNOSIS — K6289 Other specified diseases of anus and rectum: Secondary | ICD-10-CM

## 2015-11-17 DIAGNOSIS — Z862 Personal history of diseases of the blood and blood-forming organs and certain disorders involving the immune mechanism: Secondary | ICD-10-CM

## 2015-11-17 DIAGNOSIS — Z6841 Body Mass Index (BMI) 40.0 and over, adult: Secondary | ICD-10-CM

## 2015-11-17 DIAGNOSIS — E559 Vitamin D deficiency, unspecified: Secondary | ICD-10-CM | POA: Insufficient documentation

## 2015-11-17 DIAGNOSIS — R5382 Chronic fatigue, unspecified: Secondary | ICD-10-CM | POA: Diagnosis not present

## 2015-11-17 LAB — COMPLETE METABOLIC PANEL WITH GFR
ALK PHOS: 51 U/L (ref 33–115)
ALT: 11 U/L (ref 6–29)
AST: 15 U/L (ref 10–35)
Albumin: 3.8 g/dL (ref 3.6–5.1)
BUN: 6 mg/dL — ABNORMAL LOW (ref 7–25)
CALCIUM: 9.5 mg/dL (ref 8.6–10.2)
CO2: 26 mmol/L (ref 20–31)
CREATININE: 0.74 mg/dL (ref 0.50–1.10)
Chloride: 101 mmol/L (ref 98–110)
GFR, Est Non African American: >89 mL/min (ref >=60)
Glucose, Bld: 98 mg/dL (ref 65–99)
POTASSIUM: 3.8 mmol/L (ref 3.5–5.3)
Sodium: 138 mmol/L (ref 135–146)
Total Bilirubin: 0.4 mg/dL (ref 0.2–1.2)
Total Protein: 6.6 g/dL (ref 6.1–8.1)

## 2015-11-17 LAB — CBC
HEMATOCRIT: 30.7 % — AB (ref 36.0–46.0)
HEMOGLOBIN: 9.9 g/dL — AB (ref 12.0–15.0)
MCH: 24.4 pg — ABNORMAL LOW (ref 26.0–34.0)
MCHC: 32.2 g/dL (ref 30.0–36.0)
MCV: 75.6 fL — ABNORMAL LOW (ref 78.0–100.0)
MPV: 8.6 fL (ref 8.6–12.4)
Platelets: 487 10*3/uL — ABNORMAL HIGH (ref 150–400)
RBC: 4.06 MIL/uL (ref 3.87–5.11)
RDW: 16.5 % — ABNORMAL HIGH (ref 11.5–15.5)
WBC: 6.1 10*3/uL (ref 4.0–10.5)

## 2015-11-17 LAB — LIPID PANEL
Cholesterol: 180 mg/dL (ref 125–200)
HDL: 51 mg/dL (ref >=46)
LDL Cholesterol: 68 mg/dL (ref <130)
Total CHOL/HDL Ratio: 3.5 Ratio (ref <=5.0)
Triglycerides: 305 mg/dL — ABNORMAL HIGH (ref <150)
VLDL: 61 mg/dL — ABNORMAL HIGH (ref <30)

## 2015-11-17 LAB — POCT GLYCOSYLATED HEMOGLOBIN (HGB A1C): Hemoglobin A1C: 5.1

## 2015-11-17 MED ORDER — DOCUSATE SODIUM 100 MG PO CAPS: 100.0000 mg | ORAL_CAPSULE | Freq: Two times a day (BID) | ORAL | Status: DC

## 2015-11-17 NOTE — Patient Instructions (Signed)
Nice to meet you today. We are getting some labs and someone will call you or send you a letter with the results when they're available. Start taking your iron supplement every day. Continue taking your blood pressure pill. Start using Colace, stool softener, twice daily to help with rectal pain and softening bowel movements.  I will see back in 6 months, unless something arises in the meantime.  Take care,  Dr. BLeonard Schwartz

## 2015-11-17 NOTE — Assessment & Plan Note (Signed)
Could be cause of fatigue, as patient is not taking iron regularly Advised daily iron supplement as prescribed CBC today

## 2015-11-17 NOTE — Assessment & Plan Note (Signed)
Likely related to iron deficiency anemia, see plan above We'll also check a vitamin D level today

## 2015-11-17 NOTE — Assessment & Plan Note (Signed)
Well-controlled Continue HCTZ CMP today

## 2015-11-17 NOTE — Assessment & Plan Note (Signed)
Lipid panel today Not currently on statin, after calculating ASCVD 10 year risk score from today's lipid panel, consider statin therapy

## 2015-11-17 NOTE — Assessment & Plan Note (Signed)
Discussed weight loss with patient today Advised regular exercise - walking for at least 30 minutes a day 5 days a week Continue to work on healthy diet Screening A1c today

## 2015-11-17 NOTE — Progress Notes (Signed)
   Subjective:   Regina Leach is a 49 y.o. female with a history of COPD, GERD, HTN, HLD here for follow-up of chronic conditions  Hemorrhoids - intermittent constipation - usually 1 BM per day, sometimes hard - no bleeding, but does have pain when passing stool - adamantly refuses rectal exam, but really thinks she has hemorrhoids - not taking colace or other stool softeners  HTN: - Medications: HCTZ 25mg  daily - Compliance: good - Checking BP at home: no - Denies any SOB, CP, vision changes, LE edema, medication SEs, or symptoms of hypotension - Diet: avoiding fried foods, starches, bread, only eats red meat every once in a while, eating more veggies, doesn't eat a lot of fruit - Exercise: not much now, trying to get back into it  Anemia, iron deficiency: - not taking iron regularly - feeling a little more tired than usual for last several months   Review of Systems:  Per HPI. All other systems reviewed and are negative.   PMH, PSH, Medications, Allergies, and FmHx reviewed and updated in EMR.  Social History: former smoker - quit over a year ago  Objective:  BP 124/63 mmHg  Pulse 92  Temp(Src) 98.4 F (36.9 C) (Oral)  Ht 5\' 2"  (1.575 m)  Wt 243 lb (110.224 kg)  BMI 44.43 kg/m2  Gen:  49 y.o. female in NAD HEENT: NCAT, MMM, EOMI, PERRL, anicteric sclerae CV: RRR, no MRG, intact distal pulses Resp: Non-labored, CTAB, no wheezes noted Abd: Soft, NTND, BS present, no guarding or organomegaly, refuses rectal exam Ext: WWP, trace edema b/l MSK: No obvious deformity, gait normal Neuro: Alert and oriented, speech normal    Assessment & Plan:     Regina Leach is a 49 y.o. female here for the following:  Essential hypertension, benign Well-controlled Continue HCTZ CMP today  Morbid obesity with BMI of 40.0-44.9, adult Discussed weight loss with patient today Advised regular exercise - walking for at least 30 minutes a day 5 days a week Continue to  work on healthy diet Screening A1c today  History of iron deficiency anemia Could be cause of fatigue, as patient is not taking iron regularly Advised daily iron supplement as prescribed CBC today  HLD (hyperlipidemia) Lipid panel today Not currently on statin, after calculating ASCVD 10 year risk score from today's lipid panel, consider statin therapy  Rectal pain Unclear of exact source, as patient refused rectal exam today Attempt stool softening with twice a day Colace Follow-up as needed  Fatigue Likely related to iron deficiency anemia, see plan above We'll also check a vitamin D level today    Regina DownerAngela M Janalee Grobe, MD MPH PGY-2,  [facility] 11/17/2015  12:27 PM

## 2015-11-17 NOTE — Assessment & Plan Note (Signed)
Unclear of exact source, as patient refused rectal exam today Attempt stool softening with twice a day Colace Follow-up as needed

## 2015-11-18 ENCOUNTER — Telehealth: Payer: Self-pay | Admitting: Family Medicine

## 2015-11-18 LAB — VITAMIN D 25 HYDROXY (VIT D DEFICIENCY, FRACTURES): Vit D, 25-Hydroxy: 11 ng/mL — ABNORMAL LOW (ref 30–100)

## 2015-11-18 LAB — HEPATITIS C ANTIBODY: HCV AB: NEGATIVE

## 2015-11-18 MED ORDER — FERROUS SULFATE 325 (65 FE) MG PO TABS
325.0000 mg | ORAL_TABLET | Freq: Three times a day (TID) | ORAL | Status: DC
Start: 2015-11-18 — End: 2016-07-02

## 2015-11-18 MED ORDER — VITAMIN D (ERGOCALCIFEROL) 1.25 MG (50000 UNIT) PO CAPS
50000.0000 [IU] | ORAL_CAPSULE | ORAL | Status: DC
Start: 2015-11-18 — End: 2016-01-06

## 2015-11-18 NOTE — Telephone Encounter (Signed)
Called patient to discuss laboratory results. No answer. Left voicemail asking patient call back to clinic for results.  Hemoglobin A1c 5.1, indicating no diabetes. Cholesterol is good and no need for medication at this point. Hepatitis C is negative.  Her hemoglobin remains low at 9.9 and was previously 10.6. This is likely due to iron deficiency. She needs to take her iron supplement with every meal. I am sending a new prescription for this. Iron can cause constipation as we discussed and she could should continue to use the Colace as a stool softener while taking her iron.  Vitamin D level is low at 11. This could be causing her fatigue in addition to her anemia. I will send a prescription for vitamin D supplementation. She should take 1 pill weekly for the next 8 weeks. We'll switch to daily supplement of a smaller amount. We should recheck her vitamin D level after she is finished a week course. I like to see her back in clinic at that time to recheck it.  If patient returns my call please relay the above message to her. Please let me know if there are any questions.  Erasmo DownerAngela M Dalissa Lovin, MD, MPH PGY-2,  [facility] 11/18/2015 10:55 AM

## 2015-11-24 NOTE — Telephone Encounter (Signed)
Left message for patient to call back  

## 2015-12-09 ENCOUNTER — Ambulatory Visit: Payer: Medicaid Other | Admitting: Family Medicine

## 2015-12-15 ENCOUNTER — Ambulatory Visit: Payer: Medicaid Other | Admitting: Family Medicine

## 2015-12-16 ENCOUNTER — Other Ambulatory Visit: Payer: Self-pay | Admitting: *Deleted

## 2015-12-16 DIAGNOSIS — I10 Essential (primary) hypertension: Secondary | ICD-10-CM

## 2015-12-16 NOTE — Telephone Encounter (Signed)
Pt called and needs a refill on her Hydrochlorothiazide. jw °

## 2015-12-17 MED ORDER — HYDROCHLOROTHIAZIDE 25 MG PO TABS
25.0000 mg | ORAL_TABLET | Freq: Every day | ORAL | Status: DC
Start: 2015-12-17 — End: 2016-04-27

## 2016-01-06 ENCOUNTER — Ambulatory Visit (INDEPENDENT_AMBULATORY_CARE_PROVIDER_SITE_OTHER): Payer: Medicaid Other | Admitting: Family Medicine

## 2016-01-06 ENCOUNTER — Encounter: Payer: Self-pay | Admitting: Family Medicine

## 2016-01-06 VITALS — BP 125/63 | HR 78 | Temp 98.5°F | Ht 62.0 in | Wt 236.3 lb

## 2016-01-06 DIAGNOSIS — E559 Vitamin D deficiency, unspecified: Secondary | ICD-10-CM

## 2016-01-06 DIAGNOSIS — D509 Iron deficiency anemia, unspecified: Secondary | ICD-10-CM

## 2016-01-06 DIAGNOSIS — Z111 Encounter for screening for respiratory tuberculosis: Secondary | ICD-10-CM | POA: Diagnosis not present

## 2016-01-06 LAB — POCT HEMOGLOBIN: Hemoglobin: 11.1 g/dL — AB (ref 12.2–16.2)

## 2016-01-06 MED ORDER — VITAMIN D (ERGOCALCIFEROL) 1.25 MG (50000 UNIT) PO CAPS: 50000.0000 [IU] | ORAL_CAPSULE | ORAL | Status: DC

## 2016-01-06 NOTE — Assessment & Plan Note (Signed)
Hemoglobin improved from 9.9-11.1 Continue iron supplement 3 times a day Repeat CBC in one month Follow-up in 3 months

## 2016-01-06 NOTE — Progress Notes (Signed)
   Subjective:   Regina Leach is a 50 y.o. female with a history of HTN, iron deficiency anemia, HLD, vitamin D deficiency here for follow-up of vitamin D deficiency and anemia.  Vit D deficiency - took 4 weeks worth of weekly vitamin D supplement, but the pharmacy did not give her the other 4 weeks worth - off of supplement for about 1 week  - started taking once daily OTC Vit D3 last week - Energy level greatly improved  Anemia - Has not been sluggish anymore, energy level greatly improved - taking iron TID - still has menstrual periods, but she did not feel as fatigued during her last menstrual period  TB test - needs for job - works in [employer]  Review of Systems:  Per HPI. All other systems reviewed and are negative.   PMH, PSH, Medications, Allergies, and FmHx reviewed and updated in EMR.  Social History: former smoker  Objective:  BP 125/63 mmHg  Pulse 78  Temp(Src) 98.5 F (36.9 C) (Oral)  Ht  (1.575 m)  Wt 236 lb 4.8 oz (107.185 kg)  BMI 43.21 kg/m2  LMP 01/01/2016  Gen:  50 y.o. female in NAD HEENT: NCAT, MMM, EOMI, PERRL, anicteric sclerae, no conjunctival pallor CV: RRR, no MRG Resp: Non-labored, CTAB, no wheezes noted Abd: Soft, NTND, BS present, no guarding or organomegaly Ext: WWP, no edema MSK: No obvious deformities, gait intact Neuro: Alert and oriented, speech normal     POCT Hgb 11.1  Assessment & Plan:     Regina Leach is a 50 y.o. female here for vitamin D and iron deficiency anemia follow-up  Anemia, iron deficiency Hemoglobin improved from 9.9-11.1 Continue iron supplement 3 times a day Repeat CBC in one month Follow-up in 3 months  Vitamin D deficiency Resume weekly vitamin D supplement for 4 more weeks Recheck vitamin D after 4 weeks Consider daily vitamin D supplement after that     Regina Downer, MD MPH PGY-2,  [facility] 01/06/2016  10:24 AM

## 2016-01-06 NOTE — Patient Instructions (Signed)
Nice to see you again today. I'm glad things are going so well. Please take 4 more weeks worth of the vitamin D once weekly. Do not take the daily vitamin D while taking the weekly vitamin D. Please make a lab appointment in one month to check your vitamin D and your blood counts again. I like to see you back in 3 months.  Take care, Dr. Leonard Schwartz  Vitamin D Deficiency Vitamin D deficiency is when your body does not have enough vitamin D. Vitamin D is important to your body for many reasons:  It helps the body to absorb two important minerals, called calcium and phosphorus.  It plays a role in bone health.  It may help to prevent some diseases, such as diabetes and multiple sclerosis.  It plays a role in muscle function, including heart function. You can get vitamin D by:  Eating foods that naturally contain vitamin D.  Eating or drinking milk or other dairy products that have vitamin D added to them.  Taking a vitamin D supplement or a multivitamin supplement that contains vitamin D.  Being in the sun. Your body naturally makes vitamin D when your skin is exposed to sunlight. Your body changes the sunlight into a form of the vitamin that the body can use. If vitamin D deficiency is severe, it can cause a condition in which your bones become soft. In adults, this condition is called osteomalacia. In children, this condition is called rickets. CAUSES Vitamin D deficiency may be caused by:  Not eating enough foods that contain vitamin D.  Not getting enough sun exposure.  Having certain digestive system diseases that make it difficult for your body to absorb vitamin D. These diseases include Crohn disease, chronic pancreatitis, and cystic fibrosis.  Having a surgery in which a part of the stomach or a part of the small intestine is removed.  Being obese.  Having chronic kidney disease or liver disease. RISK FACTORS This condition is more likely to develop in:  Older people.  People  who do not spend much time outdoors.  People who live in a long-term care facility.  People who have had broken bones.  People with weak or thin bones (osteoporosis).  People who have a disease or condition that changes how the body absorbs vitamin D.  People who have dark skin.  People who take certain medicines, such as steroid medicines or certain seizure medicines.  People who are overweight or obese. SYMPTOMS In mild cases of vitamin D deficiency, there may not be any symptoms. If the condition is severe, symptoms may include:  Bone pain.  Muscle pain.  Falling often.  Broken bones caused by a minor injury. DIAGNOSIS This condition is usually diagnosed with a blood test.  TREATMENT Treatment for this condition may depend on what caused the condition. Treatment options include:  Taking vitamin D supplements.  Taking a calcium supplement. Your health care provider will suggest what dose is best for you. HOME CARE INSTRUCTIONS  Take medicines and supplements only as told by your health care provider.  Eat foods that contain vitamin D. Choices include:  Fortified dairy products, cereals, or juices. Fortified means that vitamin D has been added to the food. Check the label on the package to be sure.  Fatty fish, such as salmon or trout.  Eggs.  Oysters.  Do not use a tanning bed.  Maintain a healthy weight. Lose weight, if needed.  Keep all follow-up visits as told by your  health care provider. This is important. SEEK MEDICAL CARE IF:  Your symptoms do not go away.  You feel like throwing up (nausea) or you throw up (vomit).  You have fewer bowel movements than usual or it is difficult for you to have a bowel movement (constipation).   This information is not intended to replace advice given to you by your health care provider. Make sure you discuss any questions you have with your health care provider.   Document Released: 02/14/2012 Document Revised:  08/13/2015 Document Reviewed: 04/09/2015 Elsevier Interactive Patient Education [employer]

## 2016-01-06 NOTE — Assessment & Plan Note (Signed)
Resume weekly vitamin D supplement for 4 more weeks Recheck vitamin D after 4 weeks Consider daily vitamin D supplement after that

## 2016-01-09 ENCOUNTER — Ambulatory Visit (INDEPENDENT_AMBULATORY_CARE_PROVIDER_SITE_OTHER): Payer: Medicaid Other | Admitting: *Deleted

## 2016-01-09 ENCOUNTER — Encounter: Payer: Self-pay | Admitting: *Deleted

## 2016-01-09 DIAGNOSIS — Z111 Encounter for screening for respiratory tuberculosis: Secondary | ICD-10-CM

## 2016-01-09 LAB — TB SKIN TEST

## 2016-01-09 NOTE — Progress Notes (Signed)
   Patient had to have a repeat PPD placed today, missed time from previous test. PPD placed Right Forearm.  Pt to return 01/12/16 for reading.  Pt tolerated intradermal injection. Clovis Pu, RN

## 2016-01-12 ENCOUNTER — Ambulatory Visit (INDEPENDENT_AMBULATORY_CARE_PROVIDER_SITE_OTHER): Payer: Medicaid Other | Admitting: *Deleted

## 2016-01-12 ENCOUNTER — Encounter: Payer: Self-pay | Admitting: *Deleted

## 2016-01-12 DIAGNOSIS — Z7689 Persons encountering health services in other specified circumstances: Secondary | ICD-10-CM

## 2016-01-12 DIAGNOSIS — Z111 Encounter for screening for respiratory tuberculosis: Secondary | ICD-10-CM

## 2016-01-12 LAB — TB SKIN TEST
INDURATION: 0 mm
TB SKIN TEST: NEGATIVE

## 2016-01-12 NOTE — Progress Notes (Signed)
   PPD Reading Note PPD read and results entered in EpicCare. Result: 0 mm induration. Interpretation: Negative If test not read within 48-72 hours of initial placement, patient advised to repeat in other arm 1-3 weeks after this test. Allergic reaction: no  Sydell Prowell L, RN  

## 2016-02-09 ENCOUNTER — Other Ambulatory Visit: Payer: Self-pay | Admitting: *Deleted

## 2016-02-11 ENCOUNTER — Ambulatory Visit: Payer: Medicaid Other | Admitting: Family Medicine

## 2016-02-11 MED ORDER — OMEPRAZOLE 40 MG PO CPDR: 40.0000 mg | DELAYED_RELEASE_CAPSULE | Freq: Every day | ORAL | Status: DC

## 2016-02-20 ENCOUNTER — Encounter: Payer: Self-pay | Admitting: Family Medicine

## 2016-02-20 ENCOUNTER — Ambulatory Visit: Payer: Medicaid Other | Admitting: Family Medicine

## 2016-02-20 ENCOUNTER — Ambulatory Visit (INDEPENDENT_AMBULATORY_CARE_PROVIDER_SITE_OTHER): Payer: Medicaid Other | Admitting: Family Medicine

## 2016-02-20 VITALS — BP 120/64 | HR 78 | Temp 98.3°F | Ht 62.0 in | Wt 239.0 lb

## 2016-02-20 DIAGNOSIS — E559 Vitamin D deficiency, unspecified: Secondary | ICD-10-CM | POA: Diagnosis not present

## 2016-02-20 DIAGNOSIS — J069 Acute upper respiratory infection, unspecified: Secondary | ICD-10-CM | POA: Insufficient documentation

## 2016-02-20 DIAGNOSIS — D509 Iron deficiency anemia, unspecified: Secondary | ICD-10-CM | POA: Diagnosis present

## 2016-02-20 LAB — CBC
HEMATOCRIT: 37.6 % (ref 36.0–46.0)
HEMOGLOBIN: 12.4 g/dL (ref 12.0–15.0)
MCH: 28.1 pg (ref 26.0–34.0)
MCHC: 33 g/dL (ref 30.0–36.0)
MCV: 85.1 fL (ref 78.0–100.0)
MPV: 8.9 fL (ref 8.6–12.4)
Platelets: 305 10*3/uL (ref 150–400)
RBC: 4.42 MIL/uL (ref 3.87–5.11)
RDW: 15.2 % (ref 11.5–15.5)
WBC: 5.8 10*3/uL (ref 4.0–10.5)

## 2016-02-20 MED ORDER — VITAMIN D3 10 MCG (400 UNIT) PO TABS: 1200.0000 [IU] | ORAL_TABLET | Freq: Every day | ORAL | Status: DC

## 2016-02-20 NOTE — Assessment & Plan Note (Signed)
Recheck CBC today If hemoglobin has normalized, decrease iron supplementation to daily from 3 times a day

## 2016-02-20 NOTE — Assessment & Plan Note (Signed)
Symptoms consistent with viral URI No evidence of current asthma exacerbation Discussed natural course and symptomatic treatment with patient Return precautions including chest tightness, shortness of breath discussed with patient

## 2016-02-20 NOTE — Assessment & Plan Note (Signed)
Recheck vitamin D level today Start daily vitamin D supplementation

## 2016-02-20 NOTE — Patient Instructions (Signed)
Upper Respiratory Infection, Adult Most upper respiratory infections (URIs) are a viral infection of the air passages leading to the lungs. A URI affects the nose, throat, and upper air passages. The most common type of URI is nasopharyngitis and is typically referred to as "the common cold." URIs run their course and usually go away on their own. Most of the time, a URI does not require medical attention, but sometimes a bacterial infection in the upper airways can follow a viral infection. This is called a secondary infection. Sinus and middle ear infections are common types of secondary upper respiratory infections. Bacterial pneumonia can also complicate a URI. A URI can worsen asthma and chronic obstructive pulmonary disease (COPD). Sometimes, these complications can require emergency medical care and may be life threatening.  CAUSES Almost all URIs are caused by viruses. A virus is a type of germ and can spread from one person to another.  RISKS FACTORS You may be at risk for a URI if:   You smoke.   You have chronic heart or lung disease.  You have a weakened defense (immune) system.   You are very young or very old.   You have nasal allergies or asthma.  You work in crowded or poorly ventilated areas.  You work in health care facilities or schools. SIGNS AND SYMPTOMS  Symptoms typically develop 2-3 days after you come in contact with a cold virus. Most viral URIs last 7-10 days. However, viral URIs from the influenza virus (flu virus) can last 14-18 days and are typically more severe. Symptoms may include:   Runny or stuffy (congested) nose.   Sneezing.   Cough.   Sore throat.   Headache.   Fatigue.   Fever.   Loss of appetite.   Pain in your forehead, behind your eyes, and over your cheekbones (sinus pain).  Muscle aches.  DIAGNOSIS  Your health care provider may diagnose a URI by:  Physical exam.  Tests to check that your symptoms are not due to  another condition such as:  Strep throat.  Sinusitis.  Pneumonia.  Asthma. TREATMENT  A URI goes away on its own with time. It cannot be cured with medicines, but medicines may be prescribed or recommended to relieve symptoms. Medicines may help:  Reduce your fever.  Reduce your cough.  Relieve nasal congestion. HOME CARE INSTRUCTIONS   Take medicines only as directed by your health care provider.   Gargle warm saltwater or take cough drops to comfort your throat as directed by your health care provider.  Use a warm mist humidifier or inhale steam from a shower to increase air moisture. This may make it easier to breathe.  Drink enough fluid to keep your urine clear or pale yellow.   Eat soups and other clear broths and maintain good nutrition.   Rest as needed.   Return to work when your temperature has returned to normal or as your health care provider advises. You may need to stay home longer to avoid infecting others. You can also use a face mask and careful hand washing to prevent spread of the virus.  Increase the usage of your inhaler if you have asthma.   Do not use any tobacco products, including cigarettes, chewing tobacco, or electronic cigarettes. If you need help quitting, ask your health care provider. PREVENTION  The best way to protect yourself from getting a cold is to practice good hygiene.   Avoid oral or hand contact with people with cold   symptoms.   Wash your hands often if contact occurs.  There is no clear evidence that vitamin C, vitamin E, echinacea, or exercise reduces the chance of developing a cold. However, it is always recommended to get plenty of rest, exercise, and practice good nutrition.  SEEK MEDICAL CARE IF:   You are getting worse rather than better.   Your symptoms are not controlled by medicine.   You have chills.  You have worsening shortness of breath.  You have brown or red mucus.  You have yellow or brown nasal  discharge.  You have pain in your face, especially when you bend forward.  You have a fever.  You have swollen neck glands.  You have pain while swallowing.  You have white areas in the back of your throat. SEEK IMMEDIATE MEDICAL CARE IF:   You have severe or persistent:  Headache.  Ear pain.  Sinus pain.  Chest pain.  You have chronic lung disease and any of the following:  Wheezing.  Prolonged cough.  Coughing up blood.  A change in your usual mucus.  You have a stiff neck.  You have changes in your:  Vision.  Hearing.  Thinking.  Mood. MAKE SURE YOU:   Understand these instructions.  Will watch your condition.  Will get help right away if you are not doing well or get worse.   This information is not intended to replace advice given to you by your health care provider. Make sure you discuss any questions you have with your health care provider.   Document Released: 05/18/2001 Document Revised: 04/08/2015 Document Reviewed: 02/27/2014 Elsevier Interactive Patient Education 2016 Else[employer] Inc[employer]  

## 2016-02-20 NOTE — Progress Notes (Signed)
   Subjective:   Regina Leach is a 50 y.o. female with a history of Vitamin D deficiency, iron deficiency anemia here for follow-up of vitamin D and anemia, chest congestion  Vit D deficiency - s/p 8 weeks weekly supplement - feeling less tired now  Iron def anemia - feeling less tired - no one is telling her she is pale anymore - taking iron supp TID - no longer feels like eating laundry starch  Chest congestion - thinks she might have caught a cold going into patient homes - coughing up a little clear sputum x2-3 days - feeling wheezy as well - only using albuterol 1-2 times daily - eye have been watery as well - denies fevers, rhinorrhea, nasal congestion, N/V/D, SOB, CP  Review of Systems:  Per HPI.   Social History: former smoker  Objective:  BP 120/64 mmHg  Pulse 78  Temp(Src) 98.3 F (36.8 C) (Oral)  Ht 5\' 2"  (1.575 m)  Wt 239 lb (108.41 kg)  BMI 43.70 kg/m2  LMP 01/31/2016 (Approximate)  Gen:  50 y.o. female in NAD HEENT: NCAT, MMM, EOMI, PERRL, anicteric sclerae, no conjunctival pallor CV: RRR, no MRG Resp: Non-labored, end-expiratory wheezes noted diffusely, good air movement Abd: Soft, NTND, BS present, no guarding or organomegaly Ext: WWP, no edema MSK: No obvious deformities Neuro: Alert and oriented, speech normal     Assessment & Plan:     Regina Leach is a 50 y.o. female here for Follow-up of vitamin D deficiency, iron deficiency anemia, cough  Viral URI Symptoms consistent with viral URI No evidence of current asthma exacerbation Discussed natural course and symptomatic treatment with patient Return precautions including chest tightness, shortness of breath discussed with patient  Anemia, iron deficiency Recheck CBC today If hemoglobin has normalized, decrease iron supplementation to daily from 3 times a day  Vitamin D deficiency Recheck vitamin D level today Start daily vitamin D supplementation    Erasmo DownerAngela M Bacigalupo, MD  MPH PGY-2,  [facility] 02/20/2016  10:59 AM

## 2016-02-21 LAB — VITAMIN D 25 HYDROXY (VIT D DEFICIENCY, FRACTURES): Vit D, 25-Hydroxy: 21 ng/mL — ABNORMAL LOW (ref 30–100)

## 2016-03-01 ENCOUNTER — Encounter: Payer: Self-pay | Admitting: Family Medicine

## 2016-04-27 ENCOUNTER — Telehealth: Payer: Self-pay | Admitting: Family Medicine

## 2016-04-27 DIAGNOSIS — I10 Essential (primary) hypertension: Secondary | ICD-10-CM

## 2016-04-27 MED ORDER — HYDROCHLOROTHIAZIDE 25 MG PO TABS: 25.0000 mg | ORAL_TABLET | Freq: Every day | ORAL | Status: DC

## 2016-04-27 NOTE — Telephone Encounter (Signed)
[facility] [address]

## 2016-05-31 ENCOUNTER — Telehealth: Payer: Self-pay | Admitting: *Deleted

## 2016-05-31 MED ORDER — ALBUTEROL SULFATE (2.5 MG/3ML) 0.083% IN NEBU: 2.5000 mg | INHALATION_SOLUTION | Freq: Four times a day (QID) | RESPIRATORY_TRACT | Status: DC | PRN

## 2016-05-31 NOTE — Telephone Encounter (Signed)
Patient requesting refill on albuterol nebulizer solution.

## 2016-06-17 ENCOUNTER — Other Ambulatory Visit: Payer: Self-pay | Admitting: *Deleted

## 2016-06-17 MED ORDER — OMEPRAZOLE 40 MG PO CPDR: 40.0000 mg | DELAYED_RELEASE_CAPSULE | Freq: Every day | ORAL | Status: DC

## 2016-06-18 ENCOUNTER — Encounter: Payer: Medicaid Other | Admitting: Family Medicine

## 2016-06-25 ENCOUNTER — Encounter: Payer: Medicaid Other | Admitting: Family Medicine

## 2016-06-25 NOTE — Progress Notes (Deleted)
   Subjective:   Regina Leach is a 50 y.o. female with a history of HTN, morbid obesity, mood disorder, HLD, vitamin D deficiency, iron deficiency anemia here for for well woman/preventative visit   *** Acute Concerns:  Diet:  Exercise:  Sexual/Birth History:  Birth Control:  POA/Living Will:   Review of Systems: Per HPI.   PMH, PSH, Medications, Allergies, and FmHx reviewed and updated in EMR.  Social History: Former smoker  Immunization:  Tdap/TD:  Influenza: N/a  Pneumococcal: N/a  Herpes Zoster: N/a  Cancer Screening:  Pap Smear: Normal in 06/2014  Mammogram:   Colonoscopy: Will turn 50 in 2 weeks  Dexa: N/a   Objective:  There were no vitals taken for this visit.  Gen:  50 y.o. female in NAD *** HEENT: NCAT, MMM, EOMI, PERRL, anicteric sclerae CV: RRR, no MRG, no JVD Resp: Non-labored, CTAB, no wheezes noted Abd: Soft, NTND, BS present, no guarding or organomegaly Ext: WWP, no edema Gyn: External genitalia within normal limits.  Vaginal mucosa pink, moist, normal rugae.  Nonfriable cervix without lesions, no discharge or bleeding noted on speculum exam.  Bimanual exam revealed normal, nongravid uterus.  No cervical motion tenderness. No adnexal masses bilaterally.   MSK: Full ROM, strength intact Neuro: Alert and oriented, speech normal       Chemistry      Component Value Date/Time   NA 138 11/17/2015 1213   K 3.8 11/17/2015 1213   CL 101 11/17/2015 1213   CO2 26 11/17/2015 1213   BUN 6* 11/17/2015 1213   CREATININE 0.74 11/17/2015 1213   CREATININE 1.13 12/20/2008 2143      Component Value Date/Time   CALCIUM 9.5 11/17/2015 1213   ALKPHOS 51 11/17/2015 1213   AST 15 11/17/2015 1213   ALT 11 11/17/2015 1213   BILITOT 0.4 11/17/2015 1213      Lab Results  Component Value Date   WBC 5.8 02/20/2016   HGB 12.4 02/20/2016   HCT 37.6 02/20/2016   MCV 85.1 02/20/2016   PLT 305 02/20/2016   Lab Results  Component Value Date   TSH  0.933 07/06/2006   Lab Results  Component Value Date   HGBA1C 5.1 11/17/2015    Assessment & Plan:     Regina Leach is a 50 y.o. female here for *** annual well woman/preventative exam and GYN exam.  No problem-specific assessment & plan notes found for this encounter.      Erasmo DownerAngela M Majesty Stehlin, MD MPH PGY-3,  [facility] 06/25/2016  2:33 PM

## 2016-06-25 NOTE — Progress Notes (Signed)
This encounter was created in error - please disregard.

## 2016-07-02 ENCOUNTER — Other Ambulatory Visit: Payer: Self-pay | Admitting: Family Medicine

## 2016-07-02 DIAGNOSIS — Z1231 Encounter for screening mammogram for malignant neoplasm of breast: Secondary | ICD-10-CM

## 2016-07-22 ENCOUNTER — Ambulatory Visit (HOSPITAL_COMMUNITY)
Admission: EM | Admit: 2016-07-22 | Discharge: 2016-07-22 | Disposition: A | Discharge status: Home / Self Care | Payer: Medicaid Other | Attending: Family Medicine | Admitting: Family Medicine

## 2016-07-22 ENCOUNTER — Encounter (HOSPITAL_COMMUNITY): Payer: Self-pay | Admitting: Family Medicine

## 2016-07-22 ENCOUNTER — Other Ambulatory Visit: Payer: Self-pay | Admitting: Family Medicine

## 2016-07-22 DIAGNOSIS — S39012A Strain of muscle, fascia and tendon of lower back, initial encounter: Secondary | ICD-10-CM | POA: Diagnosis not present

## 2016-07-22 DIAGNOSIS — K6289 Other specified diseases of anus and rectum: Secondary | ICD-10-CM

## 2016-07-22 MED ORDER — PREDNISONE 20 MG PO TABS: ORAL_TABLET | ORAL | 0 refills | Status: DC

## 2016-07-22 MED ORDER — DOCUSATE SODIUM 100 MG PO CAPS: 100.0000 mg | ORAL_CAPSULE | Freq: Two times a day (BID) | ORAL | 2 refills | Status: DC

## 2016-07-22 MED ORDER — CYCLOBENZAPRINE HCL 10 MG PO TABS: 10.0000 mg | ORAL_TABLET | Freq: Every day | ORAL | 0 refills | Status: DC

## 2016-07-22 NOTE — Telephone Encounter (Signed)
Please let Ms. Imm know that I have refilled her medication. Thank you!

## 2016-07-22 NOTE — Telephone Encounter (Signed)
Medication refilled

## 2016-07-22 NOTE — ED Provider Notes (Signed)
[facility]    CSN: [id number] Arrival date & time: 07/22/16  1516  First Provider Contact:  First MD Initiated Contact with Patient 07/22/16 1551        History   Chief Complaint Chief Complaint  Patient presents with  . Back Pain    HPI Regina Leach is a 50 y.o. female.   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 She's had a history of low back pain in the past which is managed by rest and massage. She developed low back pain about 4 days ago and it's just gotten worse. She has difficulty bending over and helping her clients.  Patient notes no fever, weakness in her legs, or numbness in her legs. She's had no change in her appetite or bowel symptoms.  Patient has a history of asthma but her breathing has not been a problem, denying cough or wheezing.      Past Medical History:  Diagnosis Date  . Asthma     Patient Active Problem List   Diagnosis Date Noted  . Viral URI 02/20/2016  . Vitamin D deficiency 11/17/2015  . HLD (hyperlipidemia) 03/26/2015  . History of alcohol abuse 07/03/2014  . Preventative health care 07/03/2014  . Anemia, iron deficiency 08/30/2013  . GERD 01/27/2009  . 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 Essential hypertension, benign 12/20/2008  . Morbid obesity with BMI of 40.0-44.9, adult (HCC) 02/02/2007  . Former smoker 02/02/2007  . ASTHMA, INTERMITTENT 02/02/2007    History reviewed. No pertinent surgical history.  OB History    No data available       Home Medications    Prior to Admission medications   Medication Sig Start Date End Date Taking? Authorizing Provider  albuterol (PROVENTIL HFA;VENTOLIN HFA) 108 (90 BASE) MCG/ACT inhaler Inhale 2 puffs into the lungs every 6 (six) hours as needed for wheezing or shortness of breath. 03/31/15   Tommie SamsJayce G Cook, DO  albuterol (PROVENTIL) (2.5 MG/3ML) 0.083% nebulizer solution Take 3 mLs (2.5 mg total) by nebulization every 6 (six) hours as needed for  wheezing or shortness of breath. 05/31/16   Erasmo DownerAngela M Bacigalupo, MD  beclomethasone (QVAR) 80 MCG/ACT inhaler Inhale 2 puffs into the lungs 2 (two) times daily. 04/11/15   Tommie SamsJayce G Cook, DO  Cholecalciferol (VITAMIN D3) 400 units tablet Take 3 tablets (1,200 Units total) by mouth daily. 02/20/16   Erasmo DownerAngela M Bacigalupo, MD  cyclobenzaprine (FLEXERIL) 10 MG tablet Take 1 tablet (10 mg total) by mouth at bedtime. 07/22/16   Elvina SidleKurt Tylia Ewell, MD  docusate sodium (COLACE) 100 MG capsule Take 1 capsule (100 mg total) by mouth 2 (two) times daily. 11/17/15   Erasmo DownerAngela M Bacigalupo, MD  ferrous sulfate 325 (65 FE) MG tablet take 1 tablet by mouth three times a day with meals 07/02/16   Erasmo DownerAngela M Bacigalupo, MD  fluocinonide ointment (LIDEX) 0.05 % Apply 1 application topically 2 (two) times daily. 07/04/14   Tommie SamsJayce G Cook, DO  hydrochlorothiazide (HYDRODIURIL) 25 MG tablet Take 1 tablet (25 mg total) by mouth daily. For high blood pressure 04/27/16   Erasmo DownerAngela M Bacigalupo, MD  nicotine (NICODERM CQ - DOSED IN MG/24 HOURS) 14 mg/24hr patch Place 1 patch (14 mg total) onto the skin daily. 10/22/14   Tommie SamsJayce G Cook, DO  omeprazole (PRILOSEC) 40 MG capsule Take 1 capsule (40 mg total) by mouth daily. 06/17/16   Erasmo DownerAngela M Bacigalupo, MD  predniSONE (DELTASONE) 20 MG tablet Two daily with food  07/22/16   Elvina SidleKurt Yarisbel Miranda, MD  triamcinolone ointment (KENALOG) 0.1 % Apply 1 application topically 2 (two) times daily as needed. 07/10/14   Tommie SamsJayce G Cook, DO    Family History History reviewed. No pertinent family history.  Social History Social History  Substance Use Topics  . Smoking status: Former Smoker    Packs/day: 1.00    Types: Cigarettes  . Smokeless tobacco: Never Used  . Alcohol use No     Allergies   Aspirin   Review of Systems Review of Systems   Physical Exam Triage Vital Signs ED Triage Vitals  Enc Vitals Group     BP 07/22/16 1541 150/95     Pulse Rate 07/22/16 1541 90     Resp 07/22/16 1541 18     Temp  07/22/16 1541 97.8 F (36.6 C)     Temp src --      SpO2 07/22/16 1541 98 %     Weight --      Height --      Head Circumference --      Peak Flow --      Pain Score 07/22/16 1543 10     Pain Loc --      Pain Edu? --      Excl. in GC? --    No data found.   Updated Vital Signs BP 150/95   Pulse 90   Temp 97.8 F (36.6 C)   Resp 18   LMP 07/03/2016   SpO2 98%      Physical Exam HEENT: Unremarkable Chest: No respiratory distress Straight-leg raising is positive bilaterally at about 45 while sitting. Back is nontender. Patient has good muscle mass which is symmetrical in each of her legs. There is no edema in her legs or ankles.  UC Treatments / Results  Labs (all labs ordered are listed, but only abnormal results are displayed) Labs Reviewed - No data to display  EKG  EKG Interpretation None       Radiology No results found.  Procedures Procedures (including critical care time)  Medications Ordered in UC Medications - No data to display   Initial Impression / Assessment and Plan / UC Course  I have reviewed the triage vital signs and the nursing notes.  Pertinent labs & imaging results that were available during my care of the patient were reviewed by me and considered in my medical decision making (see chart for details).  Clinical Course     Final Clinical Impressions(s) / UC Diagnoses   Final diagnoses:  Lumbar strain, initial encounter    New Prescriptions New Prescriptions   CYCLOBENZAPRINE (FLEXERIL) 10 MG TABLET    Take 1 tablet (10 mg total) by mouth at bedtime.   PREDNISONE (DELTASONE) 20 MG TABLET    Two daily with food     Elvina SidleKurt Alegandro Macnaughton, MD 07/22/16 [phone]

## 2016-07-22 NOTE — Telephone Encounter (Signed)
Pt is calling for a refill on her Docusate Sodium. She said she didn't realize there were no refills until she went to the Urgent Care today. jw

## 2016-07-22 NOTE — ED Triage Notes (Signed)
p t here for lower mid back pain that has been there for 5 days. 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 sts using tylenol without relief. sts the pain radiates into her legs and buttocks

## 2016-07-28 ENCOUNTER — Ambulatory Visit
Admission: RE | Admit: 2016-07-28 | Discharge: 2016-07-28 | Disposition: A | Discharge status: Home / Self Care | Payer: Medicaid Other | Source: Ambulatory Visit | Attending: Family Medicine | Admitting: Family Medicine

## 2016-07-28 DIAGNOSIS — Z1231 Encounter for screening mammogram for malignant neoplasm of breast: Secondary | ICD-10-CM

## 2016-08-05 ENCOUNTER — Encounter: Payer: Self-pay | Admitting: Gastroenterology

## 2016-10-04 ENCOUNTER — Encounter: Payer: Medicaid Other | Admitting: Gastroenterology

## 2016-10-14 ENCOUNTER — Ambulatory Visit: Payer: Self-pay | Admitting: Family Medicine

## 2017-01-05 ENCOUNTER — Ambulatory Visit: Payer: Self-pay

## 2017-01-07 ENCOUNTER — Ambulatory Visit (INDEPENDENT_AMBULATORY_CARE_PROVIDER_SITE_OTHER): Payer: Self-pay | Admitting: *Deleted

## 2017-01-07 DIAGNOSIS — Z111 Encounter for screening for respiratory tuberculosis: Secondary | ICD-10-CM

## 2017-01-07 NOTE — Progress Notes (Signed)
   PPD placed Left Forearm.  Pt to return 01/10/17 for reading.  Pt tolerated intradermal injection. Clovis PuMartin, Tamika L, RN

## 2017-01-10 ENCOUNTER — Ambulatory Visit (INDEPENDENT_AMBULATORY_CARE_PROVIDER_SITE_OTHER): Payer: Self-pay | Admitting: *Deleted

## 2017-01-10 ENCOUNTER — Encounter: Payer: Self-pay | Admitting: *Deleted

## 2017-01-10 DIAGNOSIS — Z111 Encounter for screening for respiratory tuberculosis: Secondary | ICD-10-CM

## 2017-01-10 LAB — TB SKIN TEST
Induration: 0 mm
TB Skin Test: NEGATIVE

## 2017-01-10 NOTE — Progress Notes (Signed)
   PPD Reading Note PPD read and results entered in Epic[employer]. Result: 0 mm induration. Interpretation: Negtive If test not read within 48-72 hours of initial placement, patient advised to repeat in other arm 1-3 weeks after this test. Allergic reaction: no  Martin, Tamika L, RN  

## 2017-01-24 ENCOUNTER — Other Ambulatory Visit: Payer: Self-pay | Admitting: Family Medicine

## 2017-01-24 DIAGNOSIS — E559 Vitamin D deficiency, unspecified: Secondary | ICD-10-CM

## 2017-03-24 ENCOUNTER — Other Ambulatory Visit: Payer: Self-pay | Admitting: Family Medicine

## 2017-06-10 ENCOUNTER — Encounter (HOSPITAL_COMMUNITY): Payer: Self-pay | Admitting: Emergency Medicine

## 2017-06-10 ENCOUNTER — Emergency Department (HOSPITAL_COMMUNITY)
Admission: EM | Admit: 2017-06-10 | Discharge: 2017-06-10 | Disposition: A | Discharge status: Home / Self Care | Payer: No Typology Code available for payment source | Attending: Emergency Medicine | Admitting: Emergency Medicine

## 2017-06-10 DIAGNOSIS — Y92410 Unspecified street and highway as the place of occurrence of the external cause: Secondary | ICD-10-CM | POA: Insufficient documentation

## 2017-06-10 DIAGNOSIS — S199XXA Unspecified injury of neck, initial encounter: Secondary | ICD-10-CM | POA: Diagnosis present

## 2017-06-10 DIAGNOSIS — S161XXA Strain of muscle, fascia and tendon at neck level, initial encounter: Secondary | ICD-10-CM | POA: Insufficient documentation

## 2017-06-10 DIAGNOSIS — I10 Essential (primary) hypertension: Secondary | ICD-10-CM | POA: Insufficient documentation

## 2017-06-10 DIAGNOSIS — Z79899 Other long term (current) drug therapy: Secondary | ICD-10-CM | POA: Diagnosis not present

## 2017-06-10 DIAGNOSIS — Y998 Other external cause status: Secondary | ICD-10-CM | POA: Diagnosis not present

## 2017-06-10 DIAGNOSIS — J45909 Unspecified asthma, uncomplicated: Secondary | ICD-10-CM | POA: Diagnosis not present

## 2017-06-10 DIAGNOSIS — Z7982 Long term (current) use of aspirin: Secondary | ICD-10-CM | POA: Diagnosis not present

## 2017-06-10 DIAGNOSIS — T148XXA Other injury of unspecified body region, initial encounter: Secondary | ICD-10-CM

## 2017-06-10 DIAGNOSIS — Z87891 Personal history of nicotine dependence: Secondary | ICD-10-CM | POA: Insufficient documentation

## 2017-06-10 DIAGNOSIS — Y9389 Activity, other specified: Secondary | ICD-10-CM | POA: Diagnosis not present

## 2017-06-10 HISTORY — DX: Essential (primary) hypertension: I10

## 2017-06-10 MED ORDER — ACETAMINOPHEN 500 MG PO TABS: 1000.0000 mg | ORAL_TABLET | Freq: Three times a day (TID) | ORAL | 0 refills | Status: DC | PRN

## 2017-06-10 MED ORDER — METHOCARBAMOL 500 MG PO TABS: 500.0000 mg | ORAL_TABLET | Freq: Two times a day (BID) | ORAL | 0 refills | Status: DC

## 2017-06-10 NOTE — Discharge Instructions (Signed)
You may take Tylenol up to 3 times a day as needed for pain. Do not take extra, as this can hurt your liver. You may take Robaxin as needed for muscle stiffness or soreness. You may also use warm compresses and try the back exercises in this packet for further symptom relief. You will likely continue to have muscle pain and stiffness for the next several days. Follow-up with your primary care provider in one week if your pain is not improving. Return to the emergency department if you develop fever, chills, numbness or tingling, vomiting, or vision changes.
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
## 2017-09-24 ENCOUNTER — Emergency Department (HOSPITAL_COMMUNITY): Payer: Self-pay | Admitting: Anesthesiology

## 2017-09-24 ENCOUNTER — Emergency Department (HOSPITAL_COMMUNITY): Payer: Self-pay

## 2017-09-24 ENCOUNTER — Encounter (HOSPITAL_COMMUNITY): Payer: Self-pay | Admitting: Nurse Practitioner

## 2017-09-24 ENCOUNTER — Emergency Department (HOSPITAL_COMMUNITY)
Admission: EM | Admit: 2017-09-24 | Discharge: 2017-09-24 | Disposition: A | Discharge status: Other Acute Inpatient Hospital | Payer: Self-pay | Attending: Emergency Medicine | Admitting: Emergency Medicine

## 2017-09-24 ENCOUNTER — Encounter (HOSPITAL_COMMUNITY)
Admission: EM | Disposition: A | Discharge status: Other Acute Inpatient Hospital | Payer: Self-pay | Source: Home / Self Care | Attending: Emergency Medicine

## 2017-09-24 DIAGNOSIS — S56221A Laceration of other flexor muscle, fascia and tendon at forearm level, right arm, initial encounter: Secondary | ICD-10-CM | POA: Insufficient documentation

## 2017-09-24 DIAGNOSIS — Z87891 Personal history of nicotine dependence: Secondary | ICD-10-CM | POA: Insufficient documentation

## 2017-09-24 DIAGNOSIS — Y998 Other external cause status: Secondary | ICD-10-CM | POA: Insufficient documentation

## 2017-09-24 DIAGNOSIS — Z79899 Other long term (current) drug therapy: Secondary | ICD-10-CM | POA: Insufficient documentation

## 2017-09-24 DIAGNOSIS — J45909 Unspecified asthma, uncomplicated: Secondary | ICD-10-CM | POA: Insufficient documentation

## 2017-09-24 DIAGNOSIS — S51811A Laceration without foreign body of right forearm, initial encounter: Secondary | ICD-10-CM | POA: Insufficient documentation

## 2017-09-24 DIAGNOSIS — I10 Essential (primary) hypertension: Secondary | ICD-10-CM | POA: Insufficient documentation

## 2017-09-24 DIAGNOSIS — Y9389 Activity, other specified: Secondary | ICD-10-CM | POA: Insufficient documentation

## 2017-09-24 DIAGNOSIS — W25XXXA Contact with sharp glass, initial encounter: Secondary | ICD-10-CM | POA: Insufficient documentation

## 2017-09-24 DIAGNOSIS — Y929 Unspecified place or not applicable: Secondary | ICD-10-CM | POA: Insufficient documentation

## 2017-09-24 HISTORY — PX: NERVE, TENDON AND ARTERY REPAIR: SHX5695

## 2017-09-24 SURGERY — NERVE, TENDON AND ARTERY REPAIR
Anesthesia: General | Laterality: Right

## 2017-09-24 SURGERY — NERVE, TENDON AND ARTERY REPAIR
Anesthesia: General | Site: Arm Lower | Laterality: Right

## 2017-09-24 MED ORDER — ONDANSETRON HCL 4 MG/2ML IJ SOLN
INTRAMUSCULAR | Status: DC | PRN
  Administered 2017-09-24: 4 mg via INTRAVENOUS

## 2017-09-24 MED ORDER — HYDROMORPHONE HCL 1 MG/ML IJ SOLN
0.2500 mg | INTRAMUSCULAR | Status: DC | PRN
  Administered 2017-09-24 (×4): 0.5 mg via INTRAVENOUS

## 2017-09-24 MED ORDER — OXYCODONE-ACETAMINOPHEN 5-325 MG PO TABS
1.0000 | ORAL_TABLET | Freq: Once | ORAL | Status: AC
  Administered 2017-09-24: 1 via ORAL
  Filled 2017-09-24: qty 1

## 2017-09-24 MED ORDER — CEFAZOLIN SODIUM-DEXTROSE 2-3 GM-%(50ML) IV SOLR
INTRAVENOUS | Status: DC | PRN
  Administered 2017-09-24: 2 g via INTRAVENOUS

## 2017-09-24 MED ORDER — DEXAMETHASONE SODIUM PHOSPHATE 10 MG/ML IJ SOLN
INTRAMUSCULAR | Status: DC | PRN
  Administered 2017-09-24: 10 mg via INTRAVENOUS

## 2017-09-24 MED ORDER — PROPOFOL 10 MG/ML IV BOLUS
INTRAVENOUS | Status: AC
  Filled 2017-09-24: qty 20

## 2017-09-24 MED ORDER — OXYCODONE HCL 5 MG PO TABS
5.0000 mg | ORAL_TABLET | ORAL | Status: AC | PRN
  Administered 2017-09-24: 5 mg via ORAL

## 2017-09-24 MED ORDER — LIDOCAINE HCL (CARDIAC) 20 MG/ML IV SOLN
INTRAVENOUS | Status: DC | PRN
  Administered 2017-09-24: 100 mg via INTRAVENOUS

## 2017-09-24 MED ORDER — FENTANYL CITRATE (PF) 250 MCG/5ML IJ SOLN
INTRAMUSCULAR | Status: AC
  Filled 2017-09-24: qty 5

## 2017-09-24 MED ORDER — CEFAZOLIN SODIUM-DEXTROSE 2-4 GM/100ML-% IV SOLN
INTRAVENOUS | Status: AC
  Filled 2017-09-24: qty 100

## 2017-09-24 MED ORDER — MIDAZOLAM HCL 2 MG/2ML IJ SOLN
INTRAMUSCULAR | Status: AC
  Filled 2017-09-24: qty 2

## 2017-09-24 MED ORDER — HYDROMORPHONE HCL 1 MG/ML IJ SOLN
INTRAMUSCULAR | Status: AC
  Administered 2017-09-24: 0.5 mg via INTRAVENOUS
  Filled 2017-09-24: qty 1

## 2017-09-24 MED ORDER — EPHEDRINE 5 MG/ML INJ
INTRAVENOUS | Status: AC
  Filled 2017-09-24: qty 10

## 2017-09-24 MED ORDER — PROMETHAZINE HCL 25 MG/ML IJ SOLN
INTRAMUSCULAR | Status: AC
  Filled 2017-09-24: qty 1

## 2017-09-24 MED ORDER — SUCCINYLCHOLINE CHLORIDE 200 MG/10ML IV SOSY
PREFILLED_SYRINGE | INTRAVENOUS | Status: AC
  Filled 2017-09-24: qty 10

## 2017-09-24 MED ORDER — PHENYLEPHRINE HCL 10 MG/ML IJ SOLN
INTRAMUSCULAR | Status: DC | PRN
  Administered 2017-09-24 (×3): 80 ug via INTRAVENOUS

## 2017-09-24 MED ORDER — PROPOFOL 10 MG/ML IV BOLUS
INTRAVENOUS | Status: DC | PRN
  Administered 2017-09-24: 200 mg via INTRAVENOUS

## 2017-09-24 MED ORDER — OXYCODONE HCL 5 MG PO TABS
ORAL_TABLET | ORAL | Status: AC
  Administered 2017-09-24: 5 mg via ORAL
  Filled 2017-09-24: qty 1

## 2017-09-24 MED ORDER — FENTANYL CITRATE (PF) 100 MCG/2ML IJ SOLN
INTRAMUSCULAR | Status: DC | PRN
  Administered 2017-09-24: 25 ug via INTRAVENOUS
  Administered 2017-09-24: 150 ug via INTRAVENOUS
  Administered 2017-09-24 (×3): 25 ug via INTRAVENOUS

## 2017-09-24 MED ORDER — LACTATED RINGERS IV SOLN
INTRAVENOUS | Status: DC
  Administered 2017-09-24 (×2): via INTRAVENOUS

## 2017-09-24 MED ORDER — PROMETHAZINE HCL 25 MG/ML IJ SOLN
6.2500 mg | INTRAMUSCULAR | Status: DC | PRN
  Administered 2017-09-24: 12.5 mg via INTRAVENOUS

## 2017-09-24 MED ORDER — MIDAZOLAM HCL 5 MG/5ML IJ SOLN
INTRAMUSCULAR | Status: DC | PRN
  Administered 2017-09-24: 2 mg via INTRAVENOUS

## 2017-09-24 MED ORDER — OXYCODONE-ACETAMINOPHEN 5-325 MG PO TABS: 1.0000 | ORAL_TABLET | ORAL | 0 refills | Status: DC | PRN

## 2017-09-24 MED ORDER — ONDANSETRON HCL 4 MG/2ML IJ SOLN
INTRAMUSCULAR | Status: AC
  Filled 2017-09-24: qty 2

## 2017-09-24 MED ORDER — SODIUM CHLORIDE 0.9 % IR SOLN
Status: DC | PRN
  Administered 2017-09-24: 1000 mL

## 2017-09-24 SURGICAL SUPPLY — 46 items
BANDAGE ACE 4X5 VEL STRL LF (GAUZE/BANDAGES/DRESSINGS) ×3 IMPLANT
BNDG CMPR 9X4 STRL LF SNTH (GAUZE/BANDAGES/DRESSINGS) ×1
BNDG COHESIVE 1X5 TAN STRL LF (GAUZE/BANDAGES/DRESSINGS) ×3 IMPLANT
BNDG ESMARK 4X9 LF (GAUZE/BANDAGES/DRESSINGS) ×3 IMPLANT
BNDG GAUZE ELAST 4 BULKY (GAUZE/BANDAGES/DRESSINGS) ×3 IMPLANT
CLOSURE WOUND 1/2 X4 (GAUZE/BANDAGES/DRESSINGS)
CORDS BIPOLAR (ELECTRODE) ×3 IMPLANT
COVER SURGICAL LIGHT HANDLE (MISCELLANEOUS) ×3 IMPLANT
CUFF TOURNIQUET SINGLE 18IN (TOURNIQUET CUFF) ×3 IMPLANT
CUFF TOURNIQUET SINGLE 24IN (TOURNIQUET CUFF) IMPLANT
DECANTER SPIKE VIAL GLASS SM (MISCELLANEOUS) IMPLANT
DRAPE SURG 17X23 STRL (DRAPES) ×3 IMPLANT
DURAPREP 26ML APPLICATOR (WOUND CARE) IMPLANT
GAUZE SPONGE 4X4 12PLY STRL (GAUZE/BANDAGES/DRESSINGS) IMPLANT
GAUZE SPONGE 4X4 12PLY STRL LF (GAUZE/BANDAGES/DRESSINGS) ×3 IMPLANT
GAUZE XEROFORM 1X8 LF (GAUZE/BANDAGES/DRESSINGS) IMPLANT
GAUZE XEROFORM 5X9 LF (GAUZE/BANDAGES/DRESSINGS) ×3 IMPLANT
GLOVE SURG SYN 8.0 (GLOVE) ×9 IMPLANT
GOWN STRL REUS W/ TWL LRG LVL3 (GOWN DISPOSABLE) IMPLANT
GOWN STRL REUS W/ TWL XL LVL3 (GOWN DISPOSABLE) ×2 IMPLANT
GOWN STRL REUS W/TWL LRG LVL3 (GOWN DISPOSABLE)
GOWN STRL REUS W/TWL XL LVL3 (GOWN DISPOSABLE) ×6
KIT BASIN OR (CUSTOM PROCEDURE TRAY) ×3 IMPLANT
KIT ROOM TURNOVER OR (KITS) ×3 IMPLANT
MANIFOLD NEPTUNE II (INSTRUMENTS) ×3 IMPLANT
NEEDLE HYPO 25GX1X1/2 BEV (NEEDLE) IMPLANT
NS IRRIG 1000ML POUR BTL (IV SOLUTION) ×3 IMPLANT
PACK ORTHO EXTREMITY (CUSTOM PROCEDURE TRAY) ×3 IMPLANT
PAD ARMBOARD 7.5X6 YLW CONV (MISCELLANEOUS) ×3 IMPLANT
PAD CAST 4YDX4 CTTN HI CHSV (CAST SUPPLIES) ×1 IMPLANT
PADDING CAST COTTON 4X4 STRL (CAST SUPPLIES) ×3
SPECIMEN JAR SMALL (MISCELLANEOUS) IMPLANT
SPLINT FIBERGLASS 4X15 (CAST SUPPLIES) ×3 IMPLANT
SPLINT FINGER (SOFTGOODS) ×3 IMPLANT
STAPLER VISISTAT 35W (STAPLE) ×3 IMPLANT
STRIP CLOSURE SKIN 1/2X4 (GAUZE/BANDAGES/DRESSINGS) IMPLANT
SUT ETHILON 4 0 PS 2 18 (SUTURE) ×3 IMPLANT
SUT FIBERWIRE 3-0 18 TAPR NDL (SUTURE) ×6
SUT NYLON 9 0 VRM6 (SUTURE) ×3 IMPLANT
SUT PROLENE 3 0 PS 2 (SUTURE) IMPLANT
SUT VICRYL RAPIDE 4/0 PS 2 (SUTURE) IMPLANT
SUTURE FIBERWR 3-0 18 TAPR NDL (SUTURE) ×2 IMPLANT
SYR CONTROL 10ML LL (SYRINGE) IMPLANT
TOWEL OR 17X24 6PK STRL BLUE (TOWEL DISPOSABLE) IMPLANT
TOWEL OR 17X26 10 PK STRL BLUE (TOWEL DISPOSABLE) ×3 IMPLANT
UNDERPAD 30X30 (UNDERPADS AND DIAPERS) ×3 IMPLANT

## 2017-09-24 NOTE — Transfer of Care (Signed)
Immediate Anesthesia Transfer of Care Note  Patient: Regina Leach  Procedure(s) Performed: FLEXOR TENDON REPAIR X 4, EXTENSOR TENDON REPAIR TO RING FINGER, AND MEDIAN NERVE REPAIR TO RIGHT FOREARM (Right Arm Lower)  Patient Location: PACU  Anesthesia Type:General  Level of Consciousness: awake, alert , oriented and patient cooperative  Airway & Oxygen Therapy: Patient Spontanous Breathing and Patient connected to nasal cannula oxygen  Post-op Assessment: Report given to RN, Post -op Vital signs reviewed and stable and Patient moving all extremities X 4  Post vital signs: Reviewed and stable  Last Vitals:  Vitals:   09/24/17 0945 09/24/17 1308  BP: (!) 197/97 (!) 154/121  Pulse: 89 91  Resp: 14 20  Temp: 36.8 C   SpO2: 99% 100%    Last Pain:  Vitals:   09/24/17 0946  TempSrc:   PainSc: 0-No pain         Complications: No apparent anesthesia complications
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
## 2017-09-24 NOTE — Consult Note (Signed)
Reason for Consult: Right forearm multiple lacerations Referring Physician: Brooks SailorsKnapp  Regina Leach is an 51 y.o. female.  HPI: Patient's a very pleasant 51 year old female right hand dominant status post traumatic injury to right upper extremity when she put it through a glass window over this morning. Patient presented to the emergency department with multiple lacerations and exposed muscle. Patient denies any other symptomatology.  Past Medical History:  Diagnosis Date  . Asthma   . Hypertension     Past Surgical History:  Procedure Laterality Date  . WISDOM TOOTH EXTRACTION      No family history on file.  Social History:  reports that she has quit smoking. Her smoking use included Cigarettes. She smoked 0.25 packs per day. She has never used smokeless tobacco. She reports that she does not drink alcohol or use drugs.  Allergies:  Allergies  Allergen Reactions  . Aspirin Hives    Medications: Scheduled:  No results found for this or any previous visit (from the past 48 hour(s)).  Dg Forearm Right  Result Date: 09/24/2017 CLINICAL DATA:  51 y/o F; multiple lacerations of right forearm from glass. EXAM: RIGHT FOREARM - 2 VIEW COMPARISON:  None. FINDINGS: There is no evidence of fracture or other focal bone lesions. Extensive irregularity of soft tissues compatible with multiple lacerations. Tiny densities projecting over soft tissues of the anterior and ulnar distal forearm soft tissues may be due to overlying bandages or foreign bodies. IMPRESSION: 1. No acute osseous abnormality. 2. Tiny densities projecting over soft tissues of the anterior and ulnar distal forearm soft tissues may be due to overlying bandages or foreign bodies Electronically Signed   By: Mitzi HansenLance  Furusawa-Stratton M.D.   On: 09/24/2017 06:12   Dg Hand Complete Right  Result Date: 09/24/2017 CLINICAL DATA:  Punched a plate glass window with laceration and pain, initial encounter EXAM: RIGHT HAND - COMPLETE  3+ VIEW COMPARISON:  None. FINDINGS: No acute fracture or dislocation is noted. No definitive radiopaque foreign body is noted in the hand. Soft tissue changes in the distal forearm are noted consistent with the recent injury. IMPRESSION: No acute abnormality in the hand identified. Electronically Signed   By: Alcide CleverMark  Lukens M.D.   On: 09/24/2017 07:44    Review of Systems  All other systems reviewed and are negative.  Blood pressure (!) 197/97, pulse 89, temperature 98.2 F (36.8 C), resp. rate 14, height 5\' 3"  (1.6 m), weight 108.4 kg (239 lb), last menstrual period 09/04/2017, SpO2 99 %. Physical Exam  Constitutional: She is oriented to person, place, and time. She appears well-developed and well-nourished.  HENT:  Head: Normocephalic and atraumatic.  Neck: Normal range of motion.  Cardiovascular: Normal rate.   Respiratory: Effort normal.  Musculoskeletal:  Multiple right forearm volar radial lacerations with exposed muscle. Patient has full digital and wrist range of motion.  Neurological: She is alert and oriented to person, place, and time.  Skin: Skin is warm.  Psychiatric: She has a normal mood and affect. Her behavior is normal. Judgment and thought content normal.    Assessment/Plan: Patient's a very pleasant 51 year old female status post deep lacerations upper extremity on the right with questionable tendon and muscle injury. We will recommend expiration repair as necessary vital structures right upper extremity as an outpatient  Regina Leach 09/24/2017, 10:03 AM

## 2017-09-24 NOTE — ED Notes (Signed)
Patient transported to X-ray 

## 2017-09-24 NOTE — Anesthesia Postprocedure Evaluation (Signed)
Anesthesia Post Note  Patient: Regina Leach  Procedure(s) Performed: FLEXOR TENDON REPAIR X 4, EXTENSOR TENDON REPAIR TO RING FINGER, AND MEDIAN NERVE REPAIR TO RIGHT FOREARM (Right Arm Lower)     Patient location during evaluation: PACU Anesthesia Type: General Level of consciousness: sedated Pain management: pain level controlled Vital Signs Assessment: post-procedure vital signs reviewed and stable Respiratory status: spontaneous breathing and respiratory function stable Cardiovascular status: stable Postop Assessment: no apparent nausea or vomiting Anesthetic complications: no    Last Vitals:  Vitals:   09/24/17 1339 09/24/17 1349  BP: (!) 167/98 (!) 178/94  Pulse: 75 76  Resp: 15 12  Temp:    SpO2: 100% 98%    Last Pain:  Vitals:   09/24/17 1308  TempSrc:   PainSc: 10-Worst pain ever                 Verdun Rackley DANIEL

## 2017-09-24 NOTE — Anesthesia Preprocedure Evaluation (Addendum)
Anesthesia Evaluation  Patient identified by MRN, date of birth, ID band Patient awake    Reviewed: Allergy & Precautions, NPO status , Patient's Chart, lab work & pertinent test results  Airway Mallampati: II  TM Distance: >3 FB Neck ROM: Full    Dental  (+) Dental Advisory Given, Poor Dentition, Partial Upper   Pulmonary asthma , former smoker,    Pulmonary exam normal        Cardiovascular hypertension, Pt. on medications negative cardio ROS Normal cardiovascular exam     Neuro/Psych Anxiety negative neurological ROS  negative psych ROS   GI/Hepatic Neg liver ROS, GERD  Controlled and Medicated,  Endo/Other  negative endocrine ROSMorbid obesity  Renal/GU negative Renal ROS  negative genitourinary   Musculoskeletal negative musculoskeletal ROS (+)   Abdominal   Peds negative pediatric ROS (+)  Hematology negative hematology ROS (+)   Anesthesia Other Findings   Reproductive/Obstetrics negative OB ROS                            Anesthesia Physical Anesthesia Plan  ASA: III  Anesthesia Plan: General   Post-op Pain Management:    Induction: Intravenous  PONV Risk Score and Plan: 3 and Ondansetron, Dexamethasone and Treatment may vary due to age or medical condition  Airway Management Planned: LMA  Additional Equipment:   Intra-op Plan:   Post-operative Plan: Extubation in OR  Informed Consent: I have reviewed the patients History and Physical, chart, labs and discussed the procedure including the risks, benefits and alternatives for the proposed anesthesia with the patient or authorized representative who has indicated his/her understanding and acceptance.   Dental advisory given  Plan Discussed with: CRNA, Anesthesiologist and Surgeon  Anesthesia Plan Comments:         Anesthesia Quick Evaluation

## 2017-09-24 NOTE — ED Provider Notes (Signed)
[facility]-EMERGENCY DEPT Provider Note   CSN: [id number] Arrival date & time: 09/24/17  0456     History   Chief Complaint Chief Complaint  Patient presents with  . Laceration    HPI Regina Leach is a 51 y.o. female.  HPI   51 year old female presenting for evaluation of R hand injury.  Pt punched 2 single pane windows in succession approximately 4-5 hrs ago.  Pt sts she became upset when she found out that her son has has cancer but opted not to seek treatment.  Pt denies any altercations.  EMS initially arrived and encourage pt to come to the hospital but pt refused.  Pt eventually came here via POV.  Pt is R hand dominant.  Pt is UTD with tetanus.  Denies any specific tried at home.  Pt denies SI/HI.  She rates the pain as 8 out of 10, sharp, throbbing, with mild tingling sensation to the tip of her ring finger.   Past Medical History:  Diagnosis Date  . Asthma   . Hypertension     Patient Active Problem List   Diagnosis Date Noted  . Viral URI 02/20/2016  . Vitamin D deficiency 11/17/2015  . HLD (hyperlipidemia) 03/26/2015  . History of alcohol abuse 07/03/2014  . Preventative health care 07/03/2014  . Anemia, iron deficiency 08/30/2013  . GERD 01/27/2009  . 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 Essential hypertension, benign 12/20/2008  . Morbid obesity with BMI of 40.0-44.9, adult (HCC) 02/02/2007  . Former smoker 02/02/2007  . ASTHMA, INTERMITTENT 02/02/2007    Past Surgical History:  Procedure Laterality Date  . WISDOM TOOTH EXTRACTION      OB History    No data available       Home Medications    Prior to Admission medications   Medication Sig Start Date End Date Taking? Authorizing Provider  acetaminophen (TYLENOL) 500 MG tablet Take 2 tablets (1,000 mg total) by mouth every 8 (eight) hours as needed. 06/10/17   Caccavale, Sophia, PA-C  albuterol (PROVENTIL HFA;VENTOLIN HFA) 108 (90 BASE) MCG/ACT inhaler Inhale 2 puffs into the  lungs every 6 (six) hours as needed for wheezing or shortness of breath. 03/31/15   Everlene Other G, DO  albuterol (PROVENTIL) (2.5 MG/3ML) 0.083% nebulizer solution Take 3 mLs (2.5 mg total) by nebulization every 6 (six) hours as needed for wheezing or shortness of breath. 05/31/16   Bacigalupo, Marzella Schlein, MD  beclomethasone (QVAR) 80 MCG/ACT inhaler Inhale 2 puffs into the lungs 2 (two) times daily. 04/11/15   Tommie Sams, DO  Cholecalciferol (VITAMIN D3) 400 units tablet take 3 tablets by mouth daily 01/24/17   Erasmo Downer, MD  cyclobenzaprine (FLEXERIL) 10 MG tablet Take 1 tablet (10 mg total) by mouth at bedtime. 07/22/16   Elvina Sidle, MD  docusate sodium (COLACE) 100 MG capsule Take 1 capsule (100 mg total) by mouth 2 (two) times daily. 07/22/16   Mayo, Allyn Kenner, MD  ferrous sulfate 325 (65 FE) MG tablet take 1 tablet by mouth three times a day with meals 03/24/17   Bacigalupo, Marzella Schlein, MD  fluocinonide ointment (LIDEX) 0.05 % Apply 1 application topically 2 (two) times daily. 07/04/14   Tommie Sams, DO  hydrochlorothiazide (HYDRODIURIL) 25 MG tablet Take 1 tablet (25 mg total) by mouth daily. For high blood pressure 04/27/16   Bacigalupo, Marzella Schlein, MD  methocarbamol (ROBAXIN) 500 MG tablet Take 1 tablet (500 mg total) by mouth 2 (  two) times daily. 06/10/17   Caccavale, Sophia, PA-C  nicotine (NICODERM CQ - DOSED IN MG/24 HOURS) 14 mg/24hr patch Place 1 patch (14 mg total) onto the skin daily. 10/22/14   Tommie Sams, DO  omeprazole (PRILOSEC) 40 MG capsule Take 1 capsule (40 mg total) by mouth daily. 06/17/16   Erasmo Downer, MD  predniSONE (DELTASONE) 20 MG tablet Two daily with food 07/22/16   Elvina Sidle, MD  triamcinolone ointment (KENALOG) 0.1 % Apply 1 application topically 2 (two) times daily as needed. 07/10/14   Tommie Sams, DO    Family History No family history on file.  Social History Social History  Substance Use Topics  . Smoking status: Former Smoker     Packs/day: 0.25    Types: Cigarettes  . Smokeless tobacco: Never Used  . Alcohol use No     Allergies   Aspirin   Review of Systems Review of Systems  Constitutional: Negative for fever.  Skin: Positive for wound.  Neurological: Positive for numbness.     Physical Exam Updated Vital Signs BP (!) 162/105 (BP Location: Left Arm)   Pulse 88   Temp 98.1 F (36.7 C) (Oral)   Resp 15   Ht 5\' 3"  (1.6 m)   Wt 108.4 kg (239 lb)   LMP 09/04/2017   SpO2 97%   BMI 42.34 kg/m   Physical Exam  Constitutional: She appears well-developed and well-nourished. No distress.  HENT:  Head: Atraumatic.  Eyes: Conjunctivae are normal.  Neck: Neck supple.  Neurological: She is alert.  Skin: No rash noted.  R forearm: multiple (6) superficial and deep lacerations noted to forearm both dorsal and volar side.  Please refer to pictures for more detail.    R wrist: FROM, radial pulse 2+  R hand: several small superficial laceration noted to distal phalanges with dry blood noted. Brisk cap refills to all fingers, no nail involvement.  Decrease sensation to tip of 2nd and 3rd finger. Normal grip strength and FROM of each fingers.    Psychiatric: She has a normal mood and affect.  Nursing note and vitals reviewed.              ED Treatments / Results  Labs (all labs ordered are listed, but only abnormal results are displayed) Labs Reviewed - No data to display  EKG  EKG Interpretation None       Radiology Dg Forearm Right  Result Date: 09/24/2017 CLINICAL DATA:  51 y/o F; multiple lacerations of right forearm from glass. EXAM: RIGHT FOREARM - 2 VIEW COMPARISON:  None. FINDINGS: There is no evidence of fracture or other focal bone lesions. Extensive irregularity of soft tissues compatible with multiple lacerations. Tiny densities projecting over soft tissues of the anterior and ulnar distal forearm soft tissues may be due to overlying bandages or foreign bodies. IMPRESSION:  1. No acute osseous abnormality. 2. Tiny densities projecting over soft tissues of the anterior and ulnar distal forearm soft tissues may be due to overlying bandages or foreign bodies Electronically Signed   By: Mitzi Hansen M.D.   On: 09/24/2017 06:12    Procedures Procedures (including critical care time)  Medications Ordered in ED Medications  oxyCODONE-acetaminophen (PERCOCET/ROXICET) 5-325 MG per tablet 1 tablet (1 tablet Oral Given 09/24/17 0726)     Initial Impression / Assessment and Plan / ED Course  I have reviewed the triage vital signs and the nursing notes.  Pertinent labs & imaging results that were available  during my care of the patient were reviewed by me and considered in my medical decision making (see chart for details).     BP (!) 162/105 (BP Location: Left Arm)   Pulse 88   Temp 98.1 F (36.7 C) (Oral)   Resp 15   Ht 5\' 3"  (1.6 m)   Wt 108.4 kg (239 lb)   LMP 09/04/2017   SpO2 97%   BMI 42.34 kg/m    Final Clinical Impressions(s) / ED Diagnoses   Final diagnoses:  Laceration of right forearm, initial encounter  Laceration of flexor tendon of right forearm, initial encounter  Laceration of right forearm with complication, initial encounter    New Prescriptions New Prescriptions   No medications on file   7:34 AM Patient punched through 2 window panes and suffered significant lacerations to her right forearm. Lacerations ranging from 1-4 cm in diameter (proximal to distal: 4cm, 2.5cm, 4cm, 4cm, 1cm, 2cm). There is a deep laceration to the mid forearm with muscle and tendon involvement. Suspect laceration of the brachial radialis muscle as well as partial laceration of the tendon inferior to this muscle. Care discussed with Dr. Roselyn BeringJ Knapp.  8:19 AM Due to the involvement of muscle and tendons as well as potential nerve injury, I have consulted hand specialist Dr. Mina MarbleWeingold who agrees to perform sutures repair in [facility]. Pt will  be transfer there via POV with family member available for surgical management.  Recommend keeping pt in the system and not discharge.    9:39 AM Pt on her way to short stay.     Fayrene Helperran, Braxtyn Bojarski, PA-C 09/24/17 [id number]    Linwood DibblesKnapp, Jon, MD 09/24/17 [id number]

## 2017-09-24 NOTE — Anesthesia Procedure Notes (Signed)
Procedure Name: LMA Insertion Date/Time: 09/24/2017 11:36 AM Performed by: Rogelia BogaMUELLER, Weiland Tomich P Pre-anesthesia Checklist: Patient identified, Emergency Drugs available, Suction available, Patient being monitored and Timeout performed Patient Re-evaluated:Patient Re-evaluated prior to induction Oxygen Delivery Method: Circle system utilized Preoxygenation: Pre-oxygenation with 100% oxygen Induction Type: IV induction Ventilation: Mask ventilation without difficulty and Oral airway inserted - appropriate to patient size LMA: LMA inserted LMA Size: 4.0 Number of attempts: 1 Placement Confirmation: positive ETCO2 and breath sounds checked- equal and bilateral Tube secured with: Tape Dental Injury: Teeth and Oropharynx as per pre-operative assessment

## 2017-09-24 NOTE — Discharge Instructions (Signed)
[facility]  today and check in at Short Stay for surgical repair of your laceration by hand specialist Dr. Mina MarbleWeingold.

## 2017-09-24 NOTE — Op Note (Signed)
See dictated report [phone]

## 2017-09-24 NOTE — ED Notes (Signed)
Pt punched through glass in frustration because of recent family troubles. She has 5 lacerations on the right forearm of at least 2-3 cm in length. Bleeding is controlled with bandages.

## 2017-09-25 ENCOUNTER — Encounter (HOSPITAL_COMMUNITY): Payer: Self-pay | Admitting: Orthopedic Surgery

## 2017-09-26 NOTE — Op Note (Signed)
NAMClifton James:  Leach, Regina Leach            ACCOUNT NO.:  [id number]  MEDICAL RECORD NO.:  [id number]  LOCATION:                                 FACILITY:  PHYSICIAN:  Artist PaisMatthew A. Mina MarbleWeingold, M.D.   DATE OF BIRTH:  DATE OF PROCEDURE:  09/24/2017 DATE OF DISCHARGE:                              OPERATIVE REPORT   PREOPERATIVE DIAGNOSIS:  Right hand complex lacerations x7.  POSTOPERATIVE DIAGNOSIS:  Right hand complex lacerations x7.  PROCEDURE:  Exploration of 7 wounds, right forearm and right ring finger, with primary repair of right ring finger extensor tendon; primary repair of partial median nerve laceration, volar forearm microscopically on the right as well as primary repair of flexor carpi radialis, palmaris longus, and flexor digitorum superficialis to the index and long fingers on the right with exploration of penetrating wounds for the other wounds x5.  SURGEON:  Artist PaisMatthew A. Mina MarbleWeingold, MD.  ASSISTANT:  None.  ANESTHESIA:  General.  COMPLICATIONS:  No complications.  DRAINS:  No drains.  PROCEDURE IN DETAIL:  The patient was taken to the operating suite and after induction of adequate general anesthetic, right upper extremity was prepped and draped in a sterile fashion.  An Esmarch was used to exsanguinate the limb.  Tourniquet was inflated to 250 mmHg.  At this point in time, 7 wounds on the volar aspect of the right forearm in the mid to distal third and also along the ulnar border x2 and distally over the ring finger DIP joint, were explored.  The wounds revealed a laceration to the ring finger extensor tendon distally and in the distal- most forearm wound volarly, median nerve, and flexor tendon involvement. The other 5 wounds were thoroughly explored and irrigated with no other vital structure damage noted.  After this was completed, we then approached the distal volar wound surgically.  Under microscopic magnification, we repaired a partial laceration to the median nerve  with 9-0 nylon.  We then repaired the flexor carpi radialis, the palmaris longus, and the superficialis flexors to the index and long using 3-0 FiberWire in horizontal mattress sutures x2 with a running locked epitendinous stitch as well for all 4 tendons.  We then fixed the extensor tendon over the ring finger distally with 3-0 FiberWire as well.  We closed the finger with 4-0 nylon and the other 6 wounds with staples after thorough irrigation.  We then dressed with Xeroform, 4x4's, fluffs, and an extension splint to the tip of the ring finger and a dorsal extension block splint for the forearm and wrist on the right.  The patient tolerated these procedures well and went to the recovery room in a stable fashion.     Artist PaisMatthew A. Mina MarbleWeingold, M.D.   ______________________________ Artist PaisMatthew A. Mina MarbleWeingold, M.D.    MAW/MEDQ  D:  09/24/2017  T:  09/24/2017  Job:  [id number]

## 2017-09-29 DIAGNOSIS — M25671 Stiffness of right ankle, not elsewhere classified: Secondary | ICD-10-CM | POA: Insufficient documentation

## 2017-09-29 DIAGNOSIS — S5411XA Injury of median nerve at forearm level, right arm, initial encounter: Secondary | ICD-10-CM | POA: Insufficient documentation

## 2017-09-29 DIAGNOSIS — M79641 Pain in right hand: Secondary | ICD-10-CM | POA: Insufficient documentation

## 2017-09-29 DIAGNOSIS — S51809A Unspecified open wound of unspecified forearm, initial encounter: Secondary | ICD-10-CM | POA: Insufficient documentation

## 2017-10-10 ENCOUNTER — Other Ambulatory Visit: Payer: Self-pay | Admitting: Obstetrics and Gynecology

## 2017-10-10 DIAGNOSIS — Z1231 Encounter for screening mammogram for malignant neoplasm of breast: Secondary | ICD-10-CM

## 2017-11-08 ENCOUNTER — Ambulatory Visit
Admission: RE | Admit: 2017-11-08 | Discharge: 2017-11-08 | Disposition: A | Discharge status: Home / Self Care | Payer: No Typology Code available for payment source | Source: Ambulatory Visit | Attending: Obstetrics and Gynecology | Admitting: Obstetrics and Gynecology

## 2017-11-08 ENCOUNTER — Encounter (HOSPITAL_COMMUNITY): Payer: Self-pay

## 2017-11-08 ENCOUNTER — Ambulatory Visit (HOSPITAL_COMMUNITY)
Admission: RE | Admit: 2017-11-08 | Discharge: 2017-11-08 | Disposition: A | Discharge status: Home / Self Care | Payer: Self-pay | Source: Ambulatory Visit | Attending: Obstetrics and Gynecology | Admitting: Obstetrics and Gynecology

## 2017-11-08 VITALS — BP 156/80 | Temp 98.2°F | Ht 64.0 in | Wt 205.2 lb

## 2017-11-08 DIAGNOSIS — Z1239 Encounter for other screening for malignant neoplasm of breast: Secondary | ICD-10-CM

## 2017-11-08 DIAGNOSIS — Z1231 Encounter for screening mammogram for malignant neoplasm of breast: Secondary | ICD-10-CM

## 2017-11-08 NOTE — Progress Notes (Signed)
No complaints today.   Pap Smear: Pap smear not completed today. [facility] 2018 [facility] and normal per patient. Per patient has no history of an abnormal Pap smear. Last Pap smear is not in Epic. Previous Pap smear 07/03/2014 was normal with negative HPV is in Epic.  Physical exam: Breasts Breasts symmetrical. No skin abnormalities bilateral breasts. No nipple retraction bilateral breasts. No nipple discharge bilateral breasts. No lymphadenopathy. No lumps palpated bilateral breasts. No complaints of pain or tenderness on exam. [facility] for a screening mammogram. Appointment scheduled for Tuesday, November 08, 2017 at 1110.        Pelvic/Bimanual No Pap smear completed today since last Pap smear was in August 2018 per patient. Pap smear not indicated per BCCCP guidelines.   Smoking History: Patient is a current smoker. Discussed smoking cessation with patient. Referred to the [facility] Quitline and gave information for free smoking cessation classes at [facility].  Patient Navigation: Patient education provided. Access to services provided for patient through BCCCP program.   Colorectal Cancer Screening: Per patient has never had a colonoscopy completed. No complaints today. FIT Test given to patient to complete and return to BCCCP.

## 2017-11-08 NOTE — Patient Instructions (Signed)
Explained breast self awareness with Valentina GuJacqueline D Waibel. Patient did not need a Pap smear today due to last Pap smear was in August 2018 per patient. Let her know BCCCP will cover Pap smears every 3 years unless has a history of abnormal Pap smears. [facility] to the [facility] for a screening mammogram. Appointment scheduled for Tuesday, November 08, 2017 at 1110. Let patient know the Breast Center will follow up with her within the next couple weeks with results of mammogram by letter or phone. Discussed smoking cessation with patient. Referred to the [facility] Quitline and gave information for free smoking cessation classes at [facility]. Edward JollyJacqueline D Boan verbalized understanding.  Celestial Barnfield, Kathaleen Maserhristine Poll, RN 10:38 AM

## 2017-11-09 ENCOUNTER — Encounter (HOSPITAL_COMMUNITY): Payer: Self-pay | Admitting: *Deleted

## 2017-11-17 ENCOUNTER — Other Ambulatory Visit: Payer: Self-pay | Admitting: *Deleted

## 2017-11-17 DIAGNOSIS — E559 Vitamin D deficiency, unspecified: Secondary | ICD-10-CM

## 2017-11-17 DIAGNOSIS — I10 Essential (primary) hypertension: Secondary | ICD-10-CM

## 2017-11-17 NOTE — Telephone Encounter (Signed)
Patient left message on nurse line requesting refill on Vit D and "low blood pills." LM on VM asking which pharmacy she would like this sent to. Kinnie FeilL. Natilie Krabbenhoft, RN, BSN

## 2017-11-18 NOTE — Telephone Encounter (Signed)
Pharmacy is [facility] [address]

## 2017-11-19 MED ORDER — VITAMIN D3 10 MCG (400 UNIT) PO TABS: 1200.0000 [IU] | ORAL_TABLET | Freq: Every day | ORAL | 5 refills | Status: DC

## 2017-11-19 MED ORDER — HYDROCHLOROTHIAZIDE 25 MG PO TABS: 25.0000 mg | ORAL_TABLET | Freq: Every day | ORAL | 3 refills | Status: DC

## 2017-12-08 ENCOUNTER — Other Ambulatory Visit: Payer: Self-pay | Admitting: Family Medicine

## 2017-12-08 NOTE — Telephone Encounter (Signed)
Pt needs refill on her iron pills, she is also saying when she picked up her vit D Rx it was the wrong pills. Please contact pt and see whats going on with her meds. Please advise

## 2017-12-14 ENCOUNTER — Other Ambulatory Visit: Payer: Self-pay | Admitting: Family Medicine

## 2017-12-14 DIAGNOSIS — E559 Vitamin D deficiency, unspecified: Secondary | ICD-10-CM

## 2017-12-14 MED ORDER — VITAMIN D3 10 MCG (400 UNIT) PO TABS: 1200.0000 [IU] | ORAL_TABLET | Freq: Every day | ORAL | 2 refills | Status: DC

## 2017-12-14 MED ORDER — FERROUS SULFATE 325 (65 FE) MG PO TABS: ORAL_TABLET | ORAL | 3 refills | Status: DC

## 2017-12-14 NOTE — Telephone Encounter (Signed)
Discussed with patient and have refilled both of these for her.  She will be picking them up later today and all questions answered.

## 2017-12-23 NOTE — Telephone Encounter (Signed)
Prescriptions were sent to wrong pharmacy.  [facility] to [facility] [address]

## 2017-12-26 ENCOUNTER — Other Ambulatory Visit: Payer: Self-pay | Admitting: Family Medicine

## 2017-12-26 DIAGNOSIS — E559 Vitamin D deficiency, unspecified: Secondary | ICD-10-CM

## 2017-12-26 MED ORDER — VITAMIN D3 10 MCG (400 UNIT) PO TABS: 1200.0000 [IU] | ORAL_TABLET | Freq: Every day | ORAL | 2 refills | Status: DC

## 2017-12-26 MED ORDER — FERROUS SULFATE 325 (65 FE) MG PO TABS: ORAL_TABLET | ORAL | 3 refills | Status: DC

## 2017-12-26 NOTE — Telephone Encounter (Signed)
Called patient and left voice message that RX is ready for pickup at [facility] [address].Glennie HawkSimpson, Michelle R, CMA

## 2017-12-26 NOTE — Telephone Encounter (Signed)
I have resent them to the correct pharmacy, please inform patient.

## 2018-01-13 ENCOUNTER — Ambulatory Visit (INDEPENDENT_AMBULATORY_CARE_PROVIDER_SITE_OTHER): Payer: Self-pay | Admitting: Family Medicine

## 2018-01-13 ENCOUNTER — Encounter: Payer: Self-pay | Admitting: Family Medicine

## 2018-01-13 ENCOUNTER — Other Ambulatory Visit: Payer: Self-pay

## 2018-01-13 ENCOUNTER — Other Ambulatory Visit (HOSPITAL_COMMUNITY)
Admission: RE | Admit: 2018-01-13 | Discharge: 2018-01-13 | Disposition: A | Discharge status: Home / Self Care | Payer: No Typology Code available for payment source | Source: Ambulatory Visit | Attending: Family Medicine | Admitting: Family Medicine

## 2018-01-13 VITALS — BP 122/70 | HR 81 | Temp 98.3°F | Wt 210.0 lb

## 2018-01-13 DIAGNOSIS — Z111 Encounter for screening for respiratory tuberculosis: Secondary | ICD-10-CM

## 2018-01-13 DIAGNOSIS — N898 Other specified noninflammatory disorders of vagina: Secondary | ICD-10-CM | POA: Insufficient documentation

## 2018-01-13 DIAGNOSIS — Z124 Encounter for screening for malignant neoplasm of cervix: Secondary | ICD-10-CM

## 2018-01-13 LAB — POCT WET PREP (WET MOUNT)
CLUE CELLS WET PREP WHIFF POC: NEGATIVE
Trichomonas Wet Prep HPF POC: ABSENT

## 2018-01-13 NOTE — Progress Notes (Signed)
   Subjective:   Patient ID: Regina Leach    DOB: 12/24/1965, 52 y.o. female   MRN: [id number]  CC: vaginal discharge, Pap, TB test   HPI: Regina Leach is a 52 y.o. female who presents to clinic today for vaginal discharge.  Vaginal discharge She states she has some discharge that comes and goes.  She states it is unlike the normal physiologic discharge she usually has.  She has been married to the same partner for 19 years.  No new sexual partners.  She would like to be tested for STDs or other infections.  She denies vaginal irritation, odor, itching.  Denies abnormal bleeding, cramping, abdominal pain.  No fever, chills, nausea, vomiting.  Denies urinary symptoms.  TB test Requesting PPD placement for TB testing at work.  No known exposure to individuals with TB.  Denies symptoms of fever, cough, night sweats, weight loss, shortness of breath.    Health Maintenance Pap smear performed today.  ROS: Denies fever, chills, nausea, vomiting.  Denies cough, shortness of breath, chest pain.  Social: Patient is a current everyday smoker Medications reviewed.  Objective:   BP 122/70   Pulse 81   Temp 98.3 F (36.8 C) (Oral)   Wt 210 lb (95.3 kg)   LMP 12/26/2017   SpO2 98%   BMI 36.05 kg/m  Vitals and nursing note reviewed.  General: 52 year old female, NAD CV: RRR no MRG Lungs: Normal effort, clear bilaterally Abdomen: soft, NT ND, positive bowel sounds Pelvic exam: VULVA: normal appearing vulva with no masses, tenderness or lesions, VAGINA: normal appearing vagina with normal color and discharge, no lesions, CERVIX: normal appearing cervix without discharge or lesions, UTERUS: uterus is normal size, shape, consistency and nontender.  No CMT on bimanual exam.  Skin: warm, dry, no rash  Extremities: warm and well perfused  Assessment & Plan:   Vaginal discharge Same sexual partner x19 years.  Requesting STD testing to check for cause of vaginal discharge.  -Wet  prep -GC chlamydia -HIV and RPR declined  TB test -No risk factors identified for exposure  -PPD placed in L forearm -Return on 2/11 for read   Health maintenance -Pap smear performed today  Orders Placed This Encounter  Procedures  . POCT Wet Prep [employer](Wet [place])  . PPD    Order Specific Question:   Has patient ever tested positive?    Answer:   No   Follow-up: If symptoms worsen or do not improve  Freddrick MarchYashika Martia Dalby, MD [facility], PGY-2 01/13/2018 9:51 AM

## 2018-01-13 NOTE — Patient Instructions (Addendum)
You were seen in clinic today for vaginal discharge.  As we discussed, this can sometimes indicate an infection. We performed a wet prep and have checked for gonorrhea and chlamydia as well.  I will call you once I have the results of this and will send appropriate antibiotics to your pharmacy if needed.  Additionally, we performed a Pap test today and TB for work purposes. I will let you know of the Pap results.  Please schedule a nurse visit in 2 days for this to be read.  If you have any questions please call clinic.    Be well, Freddrick MarchYashika Karelly Dewalt, MD

## 2018-01-13 NOTE — Progress Notes (Signed)
PPD placed Left Forearm. Pt to return 01/16/2018 for reading. Pt tolerated intradermal injection well.

## 2018-01-16 ENCOUNTER — Ambulatory Visit (INDEPENDENT_AMBULATORY_CARE_PROVIDER_SITE_OTHER): Payer: Self-pay

## 2018-01-16 DIAGNOSIS — Z111 Encounter for screening for respiratory tuberculosis: Secondary | ICD-10-CM

## 2018-01-16 LAB — TB SKIN TEST
INDURATION: 0 mm
TB Skin Test: NEGATIVE

## 2018-01-16 NOTE — Progress Notes (Signed)
   Patient here for PPD reading. PPD is negative with 0 mm induration. Letter x 2 given to patient for both of her employers. Ples SpecterAlisa Brake, RN [facility] RN)

## 2018-01-17 LAB — CERVICOVAGINAL ANCILLARY ONLY
CHLAMYDIA, DNA PROBE: NEGATIVE
NEISSERIA GONORRHEA: NEGATIVE

## 2018-01-18 LAB — CYTOLOGY - PAP
Diagnosis: NEGATIVE
HPV (WINDOPATH): NOT DETECTED

## 2018-08-08 ENCOUNTER — Other Ambulatory Visit: Payer: Self-pay | Admitting: Family Medicine

## 2018-08-08 DIAGNOSIS — E559 Vitamin D deficiency, unspecified: Secondary | ICD-10-CM

## 2018-08-08 NOTE — Telephone Encounter (Signed)
Needs refill on iron pills and also vitamin D.

## 2018-08-09 MED ORDER — VITAMIN D3 10 MCG (400 UNIT) PO TABS: 1200.0000 [IU] | ORAL_TABLET | Freq: Every day | ORAL | 2 refills | Status: DC

## 2018-09-04 ENCOUNTER — Other Ambulatory Visit: Payer: Self-pay

## 2018-09-04 ENCOUNTER — Ambulatory Visit (INDEPENDENT_AMBULATORY_CARE_PROVIDER_SITE_OTHER): Payer: Self-pay | Admitting: Student in an Organized Health Care Education/Training Program

## 2018-09-04 VITALS — BP 152/94 | HR 75 | Temp 98.7°F | Ht 64.0 in | Wt 209.6 lb

## 2018-09-04 DIAGNOSIS — J45901 Unspecified asthma with (acute) exacerbation: Secondary | ICD-10-CM

## 2018-09-04 DIAGNOSIS — Z23 Encounter for immunization: Secondary | ICD-10-CM

## 2018-09-04 DIAGNOSIS — N898 Other specified noninflammatory disorders of vagina: Secondary | ICD-10-CM

## 2018-09-04 MED ORDER — PREDNISONE 10 MG PO TABS: ORAL_TABLET | ORAL | 0 refills | Status: AC

## 2018-09-04 MED ORDER — ALBUTEROL SULFATE (2.5 MG/3ML) 0.083% IN NEBU
2.5000 mg | INHALATION_SOLUTION | Freq: Once | RESPIRATORY_TRACT | Status: AC
  Administered 2018-09-04: 2.5 mg via RESPIRATORY_TRACT

## 2018-09-04 MED ORDER — IPRATROPIUM BROMIDE 0.02 % IN SOLN
0.5000 mg | Freq: Once | RESPIRATORY_TRACT | Status: AC
  Administered 2018-09-04: 0.5 mg via RESPIRATORY_TRACT

## 2018-09-04 MED ORDER — BECLOMETHASONE DIPROP HFA 40 MCG/ACT IN AERB: 2.0000 | INHALATION_SPRAY | Freq: Two times a day (BID) | RESPIRATORY_TRACT | 3 refills | Status: DC

## 2018-09-04 NOTE — Progress Notes (Signed)
   CC: asthma exacerbation  HPI: Regina Leach is a 52 y.o. female with PMH significant for intermittent asthma who presents to [facility] today with wheezing and URI symptoms of 3 weeks duration.   Wheezing and URI - Patient endorses several weeks of rhinorrhea, congestion, and coughing up phlegm. She has had associated wheezing for which she takes her albuterol inhaler. She has not had any fevers. 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 She had mild ear pain with congestion which is improving. She has been using albuterol inhaler twice per day for the last month for wheezing and coughing. She has QVAR in her chart but does not remember ever being on this medication and has not been using it. She has never been hospitalized for asthma in her memory.   Spot on vagina  -  Patient first noticed a small bump on the right labia 3-4 days ago. She reports it may have drained because swelling has gone down and soreness has improved. She has been using a cream that was previously prescribed for her daughter (she does not know the name) and she has been taking epsom baths. No prior similar episodes. No fevers.   Review of Symptoms:  See HPI for ROS.   CC, SH/smoking status, and VS noted.  Objective: BP (!) 152/94   Pulse 75   Temp 98.7 F (37.1 C) (Oral)   Ht 5\' 4"  (1.626 m)   Wt 209 lb 9.6 oz (95.1 kg)   LMP 04/05/2018 Comment: states it stopped in may 2019  SpO2 95%   BMI 35.98 kg/m  GEN: NAD, alert, cooperative, and pleasant. EYE: no conjunctival injection, pupils equally round and reactive to light ENMT: normal tympanic light reflex, no nasal polyps,no rhinorrhea, no pharyngeal erythema or exudates NECK: full ROM RESPIRATORY: +scattered soft end expiratory wheezes, no rhonchi or rales, good effort, comfortable work of breathing, speaks in full sentences CV: RRR, no m/r/g, no peripheral edema GI: soft, non-tender,  non-distended NEURO: II-XII grossly intact PSYCH: AAOx3, appropriate affect GU: tiny ~0.5cm open lesion likely was a small pimple that has drained.No induration. No surrounding erythema or warmth.  Assessment and plan:  Asthma exacerbation - likely triggered by viral illnesses.No evidence of bacterial infection or pneumonia. No  - restart QVAR 2 puffs BID - prednisone burst originally ordered, however patient then endorsed history of GI bleed on this medication so we decided against the prednisone. Shredded the paper script. - breathing treatment given in the office today - reasons to return to care discussed - continue to encourage tobacco cessation  Lesion on vagina - tiny ~0.5cm open lesion likely was a small pimple that has drained. No surrounding erythema to suggest infection - continue epsom baths - return precautions advised  Howard Pouch, MD,MS,  PGY3 09/04/2018 10:24 PM

## 2018-09-04 NOTE — Patient Instructions (Signed)
It was a pleasure seeing you today in our clinic.   Please take the QVAR inhaler every day as prescribed Use albuterol as needed  I sent a steroid taper to your pharmacy  Our clinic's number is [phone]. Please call with questions or concerns about what we discussed today.  Be well, Dr. Mosetta Putt

## 2018-10-09 ENCOUNTER — Telehealth: Payer: Self-pay

## 2018-10-09 NOTE — Telephone Encounter (Signed)
Pt LVM on nurse line stating the medication "Anoro" is too expensive for her to pick up, for her breathing. Pt stated this really works for her and is wanting to know if we have samples. I did not see this on her medication list.

## 2018-10-11 NOTE — Telephone Encounter (Signed)
Called and made an appointment with Dr. Raymondo Band for PFT's per Dr. Nelson Chimes. Patient will be evaluated to see if she needs samples of Anoro which she can not afford.  Glennie Hawk, CMA

## 2018-10-11 NOTE — Telephone Encounter (Signed)
Discussed with Dr. Raymondo Band-  Would recommend she get PFTs done first to evaluate and depending on this we may provide her with samples.  Please schedule patient in pharmacy clinic for PFTs and inform patient of this as I am on nights this month.  Of note, she should not take Anoro 24 hours prior to her appointment as this can affect results of the study. Thanks

## 2018-10-12 ENCOUNTER — Encounter: Payer: Self-pay | Admitting: Pharmacist

## 2018-10-12 ENCOUNTER — Ambulatory Visit (INDEPENDENT_AMBULATORY_CARE_PROVIDER_SITE_OTHER): Payer: Self-pay | Admitting: Pharmacist

## 2018-10-12 VITALS — BP 160/96 | HR 102 | Resp 30 | Ht 64.0 in | Wt 208.6 lb

## 2018-10-12 DIAGNOSIS — Z720 Tobacco use: Secondary | ICD-10-CM

## 2018-10-12 DIAGNOSIS — J45901 Unspecified asthma with (acute) exacerbation: Secondary | ICD-10-CM

## 2018-10-12 DIAGNOSIS — J4531 Mild persistent asthma with (acute) exacerbation: Secondary | ICD-10-CM

## 2018-10-12 MED ORDER — FLUTICASONE FUROATE-VILANTEROL 200-25 MCG/INH IN AEPB: 1.0000 | INHALATION_SPRAY | Freq: Every day | RESPIRATORY_TRACT | 0 refills | Status: DC

## 2018-10-12 MED ORDER — AZITHROMYCIN 250 MG PO TABS: ORAL_TABLET | ORAL | 0 refills | Status: DC

## 2018-10-12 MED ORDER — ALBUTEROL SULFATE HFA 108 (90 BASE) MCG/ACT IN AERS: 2.0000 | INHALATION_SPRAY | Freq: Four times a day (QID) | RESPIRATORY_TRACT | 3 refills | Status: DC | PRN

## 2018-10-12 MED FILL — AZITHROMYCIN 250 MG TABLET: 250 | 5 days supply | Qty: 6 | Fill #0

## 2018-10-12 NOTE — Progress Notes (Signed)
Patient ID: Regina Leach, female   DOB: 1966/05/27, 52 y.o.   MRN: [id number] Reviewed: agree with Dr. Macky Lower documentation and management.

## 2018-10-12 NOTE — Progress Notes (Signed)
S:  Patient arrives in good spirits, but coughing and with an increased effort to breathe. Presents for lung function evaluation.  Patient was referred by Dr. Mosetta Putt (referred on 09/04/18) after a visit for an acute asthma exacerbation. At that time, it was discussed to prescribe a prednisone burst, but the patient had previously had GI bleeding with prednisone, so it was decided not to prescribe this.   She notes that over the past 2 months, her breathing has been much more difficult. She endorses significant shortness of breath, coughing, and some wheezing. She has lost her insurance, so has not had any inhaler medications for over a year. She endorses significant chest congestion and increased dyspnea over the past few days, but denies change in sputum color.  She reports that albuterol historically "doesn't do much" for her. She was unable to afford inhaled corticosteroids in the past and cannot remember ever trying one. She notes that a friend has Anoro (LAMA/LABA) and she has tried this; reports improvement in breathing.   She notes that she completed paperwork with Annice Pih today to apply for [employer].   Patient denies adherence to medications due to not being able to afford them. Patient reports last dose of asthma medications was at least a year ago  Current asthma medications: none Rescue inhaler use frequency: none (does not have one)  Level of asthma sx control- in the last 4 weeks: Question Scoring Patient Score  Daytime sx more than twice a week Yes (1) No (0) 1  Any nighttime waking due to asthma Yes (1) No (0) 1  Reliever needed >2x/week Yes (1) No (0) 1  Any activity limitation due to asthma Yes (1) No (0) 1   Total Score   Well controlled - 0, partly controlled - 1-2, uncontrolled 3-4  Age when started using tobacco on a daily basis since age 89 Brand smoked Newports or [place] Lights/shorts?. Number of cigarettes/day 3.   Denies waking to smoke   [place]  Score Question Scoring Patient Score  How soon after waking do you smoke your first cigarette? <5 mins (3) 5-30 mins (2) 31-60 mins (1) >60 mins (0) 0  Do you find it difficult NOT to smoke in places where you shouldn't? Yes(1) No (0) 0  Which cigarette would you most hate to give up? First one in AM (1)  Any other one (0) 1     How many cigarettes do you smoke/day? 10 or less (0) 11-20 (1) 21-30 (2) >30 (3) 0  Do you smoke more during the first few hours after waking? Yes (1) No (0) 0  Do you smoke if you are so ill you cannot get out of bed? Yes (1) No (0) 0   Total Score   Score interpretation: low 1-2, low-to-moderate 3-4, moderate 5-7, high >7  Most recent quit attempt last year  Longest time ever been tobacco free for about a year.  Medications used in past cessation efforts include: NRT x2 days, then quit  Rates IMPORTANCE of quitting tobacco on 1-10 scale of 10 Rates CONFIDENCE of quitting tobacco on 1-10 scale of 9.  Most common triggers to use tobacco include; worrying about son's health;   Motivation to quit: worsened breathing  O: Physical Exam  Constitutional: She appears well-developed and well-nourished. She appears distressed.  Pulmonary/Chest: She is in respiratory distress. She has wheezes.  Productive cough   Review of Systems  Respiratory: Positive for cough, sputum production, shortness of breath and  wheezing.     Vitals:   10/12/18 1500  BP: (!) 160/96  Pulse: (!) 102  Resp: (!) 30  SpO2: 95%    See "scanned report" or Documentation Flowsheet (discrete results - PFTs) for  Spirometry results. Patient provided good effort while attempting spirometry.   Lung Age = 89 Albuterol nebulizer: Lot # [id number] Expiration Date: 04/21   A/P: Patient has been experiencing coughing, SOB, and overall difficulty breathing for 2 months and taking no medications. Spirometry evaluation reveals obstructive lung function with increased work of breathing  and dyspnea at rest. Spirometry evaluation reveals Severe obstructive lung disease. Post nebulized albuterol tx did not produce improvement per patient. Per Dr. Mosetta Putt, lungs more congested and patient more symptomatic than last visit. Precepted with Dr. Manson Passey, below therapy discussed  -Begin Breo Ellipta 200/25 (fluticasone/vilanterol) inhaler once daily. Samples provided. Avoided systemic steroids due to patient reported GI bleed with prednisone in the past.  -Begin albuterol HFA Q4-6 hours PRN for SOB. Prescription send to outpatient pharmacy -Begin Z pack (azithromycin 500 mg x1, then 250 mg x4 days). Prescription sent to outpatient pharmacy.  -Educated patient on purpose, proper use, potential adverse effects of inhaled corticosteroids. -Reviewed results of pulmonary function tests.  -Patient scheduled for follow up Access to Care appointment tomorrow.  Consider initiation of montelukast tx in the future.  -Encouraged patient to contact clinic moving forward for medication access concerns.  Tobacco abuse; low level of dependence with high probability for success of cessation d/t motivation to quit. Patient willing to pursue nicotine replacement therapy - Recommended 1-800-QUIT-NOW quit line for support and NRT   Patient verbalized understanding of results and education.  Written pt instructions provided.   F/U ATC Clinic visit tomorrow. Total time in face to face counseling 45 minutes.  Patient seen with Belva Agee, PharmD Candidate and Catie Feliz Beam, PharmD,  PGY2 Pharmacy Resident.   Marland Kitchen

## 2018-10-12 NOTE — Assessment & Plan Note (Signed)
Tobacco abuse; low level of dependence with high probability for success of cessation d/t motivation to quit. Patient willing to pursue nicotine replacement therapy - Recommended 1-800-QUIT-NOW quit line for support and NRT

## 2018-10-12 NOTE — Patient Instructions (Signed)
Your lung test today showed lung disease.   We are starting a medication called Breo. You will take 1 puff once daily.   We have sent a prescription for albuterol inhaler and azithromycin (antibiotic) to the outpatient pharmacy.    Please call clinic in the future before you run out of medications. We may be able to help.   Schedule an appointment tomorrow to be seen for your breathing.

## 2018-10-12 NOTE — Assessment & Plan Note (Signed)
Patient has been experiencing coughing, SOB, and overall difficulty breathing for 2 months and taking no medications. Spirometry evaluation reveals obstructive lung function with increased work of breathing and dyspnea at rest. Spirometry evaluation reveals Severe obstructive lung disease. Post nebulized albuterol tx did not produce improvement per patient. Per Dr. Mosetta Putt, lungs more congested and patient more symptomatic than last visit. Precepted with Dr. Manson Passey, below therapy discussed  -Begin Breo Ellipta 200/25 (fluticasone/vilanterol) inhaler once daily. Samples provided. Avoided systemic steroids due to patient reported GI bleed with prednisone in the past.  -Begin albuterol HFA Q4-6 hours PRN for SOB. Prescription send to outpatient pharmacy -Begin Z pack (azithromycin 500 mg x1, then 250 mg x4 days). Prescription sent to outpatient pharmacy.  -Educated patient on purpose, proper use, potential adverse effects of inhaled corticosteroids. -Reviewed results of pulmonary function tests.  -Patient scheduled for follow up Access to Care appointment tomorrow.  Consider initiation of montelukast tx in the future.  -Encouraged patient to contact clinic moving forward for medication access concerns.

## 2018-10-13 ENCOUNTER — Ambulatory Visit (INDEPENDENT_AMBULATORY_CARE_PROVIDER_SITE_OTHER): Payer: Self-pay | Admitting: Family Medicine

## 2018-10-13 ENCOUNTER — Ambulatory Visit: Payer: No Typology Code available for payment source | Admitting: Family Medicine

## 2018-10-13 DIAGNOSIS — J45901 Unspecified asthma with (acute) exacerbation: Secondary | ICD-10-CM | POA: Insufficient documentation

## 2018-10-13 HISTORY — DX: Unspecified asthma with (acute) exacerbation: J45.901

## 2018-10-13 MED ORDER — ALBUTEROL SULFATE HFA 108 (90 BASE) MCG/ACT IN AERS: 2.0000 | INHALATION_SPRAY | Freq: Four times a day (QID) | RESPIRATORY_TRACT | 3 refills | Status: DC | PRN

## 2018-10-13 NOTE — Patient Instructions (Signed)
It was great to meet you today! Thank you for letting me participate in your care!  Today, we discussed your acute asthma exacerbation. You should get the albuterol and take 2 puffs every 4-6 hours for the next two days. If your symptoms improve you can then stretch it out to 2 puffs every 8 hours for the next 1-2 days. If you symptoms are then resolved please use albuterol only as you need it. Continue taking Breo as previously instructed.  Be well, Regina Schick, DO PGY-2, [facility]   Asthma, Adult Asthma is a condition of the lungs in which the airways tighten and narrow. Asthma can make it hard to breathe. Asthma cannot be cured, but medicine and lifestyle changes can help control it. Asthma may be started (triggered) by:  Animal skin flakes (dander).  Dust.  Cockroaches.  Pollen.  Mold.  Smoke.  Cleaning products.  Hair sprays or aerosol sprays.  Paint fumes or strong smells.  Cold air, weather changes, and winds.  Crying or laughing hard.  Stress.  Certain medicines or drugs.  Foods, such as dried fruit, potato chips, and sparkling grape juice.  Infections or conditions (colds, flu).  Exercise.  Certain medical conditions or diseases.  Exercise or tiring activities.  Follow these instructions at home:  Take medicine as told by your doctor.  Use a peak flow meter as told by your doctor. A peak flow meter is a tool that measures how well the lungs are working.  Record and keep track of the peak flow meter's readings.  Understand and use the asthma action plan. An asthma action plan is a written plan for taking care of your asthma and treating your attacks.  To help prevent asthma attacks: ? Do not smoke. Stay away from secondhand smoke. ? Change your heating and air conditioning filter often. ? Limit your use of fireplaces and wood stoves. ? Get rid of pests (such as roaches and mice) and their droppings. ? Throw away plants if you see  mold on them. ? Clean your floors. Dust regularly. Use cleaning products that do not smell. ? Have someone vacuum when you are not home. Use a vacuum cleaner with a HEPA filter if possible. ? Replace carpet with wood, tile, or vinyl flooring. Carpet can trap animal skin flakes and dust. ? Use allergy-proof pillows, mattress covers, and box spring covers. ? Wash bed sheets and blankets every week in hot water and dry them in a dryer. ? Use blankets that are made of polyester or cotton. ? Clean bathrooms and kitchens with bleach. If possible, have someone repaint the walls in these rooms with mold-resistant paint. Keep out of the rooms that are being cleaned and painted. ? Wash hands often. Contact a doctor if:  You have make a whistling sound when breaking (wheeze), have shortness of breath, or have a cough even if taking medicine to prevent attacks.  The colored mucus you cough up (sputum) is thicker than usual.  The colored mucus you cough up changes from clear or white to yellow, green, gray, or bloody.  You have problems from the medicine you are taking such as: ? A rash. ? Itching. ? Swelling. ? Trouble breathing.  You need reliever medicines more than 2-3 times a week.  Your peak flow measurement is still at 50-79% of your personal best after following the action plan for 1 hour.  You have a fever. Get help right away if:  You seem to be  worse and are not responding to medicine during an asthma attack.  You are short of breath even at rest.  You get short of breath when doing very little activity.  You have trouble eating, drinking, or talking.  You have chest pain.  You have a fast heartbeat.  Your lips or fingernails start to turn blue.  You are light-headed, dizzy, or faint.  Your peak flow is less than 50% of your personal best. This information is not intended to replace advice given to you by your health care provider. Make sure you discuss any questions you  have with your health care provider. Document Released: 05/10/2008 Document Revised: 04/29/2016 Document Reviewed: 06/21/2013 Elsevier Interactive Patient Education  2017 [employer].

## 2018-10-13 NOTE — Assessment & Plan Note (Signed)
Patient had to leave before douneb treatment in clinic today but stated her family member has a machine that she could use and she would get a treatment this afternoon. - Using GoodRx gave prescription for Albuterol inhaler to use q4hrs scheduled for the next two days, then q8hrs for 1-2 days, then go to PRN use - Continue Breo - Follow up on Monday if no improvement

## 2018-10-13 NOTE — Progress Notes (Signed)
     Subjective: HPI: Regina Leach is a 52 y.o. presenting to clinic today to discuss the following:  Asthma Follow Up Patient presents today for follow up for poorly controlled asthma due to medication non-compliance secondary to medication costs. Patient was seen in our clinic yesterday and received Breo and a nebulizer treatment and a coupon for albuterol. She still cannot afford the albuterol and is here for a nebulizer treatment.  Health Maintenance: None     ROS noted in HPI.   Past Medical, Surgical, Social, and Family History Reviewed & Updated per EMR.   Pertinent Historical Findings include:   Social History   Tobacco Use  Smoking Status Current Every Day Smoker  . Packs/day: 0.25  . Types: Cigarettes  Smokeless Tobacco Never Used    Objective: BP (!) 160/90   Pulse 98   Temp 98.3 F (36.8 C) (Oral)   SpO2 97%  Vitals and nursing notes reviewed  Physical Exam Gen: Alert and Oriented x 3, NAD HEENT: Normocephalic, atraumatic CV: RRR, no murmurs, normal S1, S2 split Resp: Diffuse wheezing bilaterally in all lung fields, no rales or rhonchi, NWOB Ext: no clubbing, cyanosis, or edema, 2 sec cap refill Skin: warm, dry, intact, no rashes  No results found for this or any previous visit (from the past 72 hour(s)).  Assessment/Plan:  Asthma with acute exacerbation Patient had to leave before douneb treatment in clinic today but stated her family member has a machine that she could use and she would get a treatment this afternoon. - Using [facility]Rx gave prescription for Albuterol inhaler to use q4hrs scheduled for the next two days, then q8hrs for 1-2 days, then go to PRN use - Continue Breo - Follow up on Monday if no improvement  Cannot have Prednisone due to history of GI bleed  PATIENT EDUCATION PROVIDED: See AVS    Diagnosis and plan along with any newly prescribed medication(s) were discussed in detail with this patient today. The patient verbalized  understanding and agreed with the plan. Patient advised if symptoms worsen return to clinic or ER.   Health Maintainance: none today   No orders of the defined types were placed in this encounter.   Meds ordered this encounter  Medications  . albuterol (PROVENTIL HFA;VENTOLIN HFA) 108 (90 Base) MCG/ACT inhaler    Sig: Inhale 2 puffs into the lungs every 6 (six) hours as needed for wheezing or shortness of breath.    Dispense:  2 Inhaler    Refill:  3     Jules Schick, DO 10/13/2018, 11:48 AM PGY-2 [facility]

## 2018-10-27 ENCOUNTER — Other Ambulatory Visit (HOSPITAL_COMMUNITY): Payer: Self-pay | Admitting: *Deleted

## 2018-10-27 DIAGNOSIS — Z1231 Encounter for screening mammogram for malignant neoplasm of breast: Secondary | ICD-10-CM

## 2018-11-07 ENCOUNTER — Telehealth: Payer: Self-pay

## 2018-11-07 NOTE — Telephone Encounter (Signed)
Pt called nurse line stating [facility]. Pt states she is still waiting for her orange card and is wanting to know if more samples of Breo can be provided??

## 2018-11-07 NOTE — Telephone Encounter (Signed)
I am not sure, however will route to Dr. Raymondo BandKoval who may know

## 2018-11-09 NOTE — Telephone Encounter (Signed)
Contacted patient to determine what supply of Breo she has at home currently. Left HIPAA compliant message for her to return my clal.   Catie Feliz Beamravis, PharmD PGY2 Ambulatory Care Pharmacy Resident, [facility] Phone: [phone]

## 2018-11-09 NOTE — Telephone Encounter (Signed)
Prepared sample of Breo for patient:   Medication Samples have been provided to the patient.  Drug: Breo  Strength: 100/7625mcg Qty: 1 LOT: [id number] Exp.Date: 12/2019 Dosing instructions: Inhale one puff once daily  Placed at the front of clinic    Catie Feliz Beamravis, PharmD PGY2 Ambulatory Care Pharmacy Resident, [facility] Phone: [phone]

## 2019-01-09 ENCOUNTER — Ambulatory Visit (HOSPITAL_COMMUNITY): Payer: No Typology Code available for payment source

## 2019-01-10 ENCOUNTER — Ambulatory Visit (INDEPENDENT_AMBULATORY_CARE_PROVIDER_SITE_OTHER): Payer: Self-pay

## 2019-01-10 DIAGNOSIS — Z111 Encounter for screening for respiratory tuberculosis: Secondary | ICD-10-CM

## 2019-01-10 NOTE — Progress Notes (Signed)
   Tuberculin skin test applied to left ventral forearm. Appointment made for 01/12/19 for PPD reading.  Ples Specter, RN [facility] RN)

## 2019-01-12 ENCOUNTER — Ambulatory Visit (INDEPENDENT_AMBULATORY_CARE_PROVIDER_SITE_OTHER): Payer: Self-pay

## 2019-01-12 DIAGNOSIS — Z111 Encounter for screening for respiratory tuberculosis: Secondary | ICD-10-CM

## 2019-01-12 LAB — TB SKIN TEST
Induration: 0 mm
TB Skin Test: NEGATIVE

## 2019-01-12 NOTE — Progress Notes (Signed)
   Patient here today to have PPD site read.   PPD read and results entered in Epic. Result: 0 mm induration. Interpretation: Negative Letter given for employer.  Alisa Brake, RN ([facility] RN) 

## 2019-01-24 ENCOUNTER — Telehealth: Payer: Self-pay

## 2019-01-24 NOTE — Telephone Encounter (Signed)
Patient asks for medication for BP and GERD that is on $4 [facility] list d/t finances.  Patient asked for this on 2/7 when in for TB test but note was not sent.  Please advise: [phone]  Ples Specter, RN [facility] RN)

## 2019-01-25 ENCOUNTER — Other Ambulatory Visit: Payer: Self-pay | Admitting: Family Medicine

## 2019-01-25 MED ORDER — PANTOPRAZOLE SODIUM 40 MG PO TBEC: 40.0000 mg | DELAYED_RELEASE_TABLET | Freq: Every day | ORAL | 2 refills | Status: DC

## 2019-01-25 NOTE — Telephone Encounter (Signed)
I have changed her GERD medication and sent this to pharmacy.  Regarding her HCTZ  I see it is reported as not taking however this should likely be covered by her insurance. Please inform patient, thanks

## 2019-01-25 NOTE — Telephone Encounter (Signed)
LMOVM informing pt. Regina Leach, CMA  

## 2019-02-01 ENCOUNTER — Ambulatory Visit (INDEPENDENT_AMBULATORY_CARE_PROVIDER_SITE_OTHER): Payer: Self-pay | Admitting: Family Medicine

## 2019-02-01 ENCOUNTER — Telehealth: Payer: Self-pay

## 2019-02-01 ENCOUNTER — Other Ambulatory Visit: Payer: Self-pay

## 2019-02-01 VITALS — BP 136/80 | HR 82 | Temp 98.6°F | Ht 64.0 in | Wt 218.6 lb

## 2019-02-01 DIAGNOSIS — I10 Essential (primary) hypertension: Secondary | ICD-10-CM

## 2019-02-01 DIAGNOSIS — Z0000 Encounter for general adult medical examination without abnormal findings: Secondary | ICD-10-CM

## 2019-02-01 DIAGNOSIS — Z1159 Encounter for screening for other viral diseases: Secondary | ICD-10-CM

## 2019-02-01 DIAGNOSIS — J4531 Mild persistent asthma with (acute) exacerbation: Secondary | ICD-10-CM

## 2019-02-01 MED ORDER — IPRATROPIUM BROMIDE 0.02 % IN SOLN
0.5000 mg | Freq: Once | RESPIRATORY_TRACT | Status: AC
  Administered 2019-02-01: 0.5 mg via RESPIRATORY_TRACT

## 2019-02-01 MED ORDER — OMEPRAZOLE 20 MG PO CPDR: 20.0000 mg | DELAYED_RELEASE_CAPSULE | Freq: Every day | ORAL | 3 refills | Status: DC

## 2019-02-01 MED ORDER — HYDROCHLOROTHIAZIDE 25 MG PO TABS: 25.0000 mg | ORAL_TABLET | Freq: Every day | ORAL | 3 refills | Status: DC

## 2019-02-01 MED ORDER — ALBUTEROL SULFATE (2.5 MG/3ML) 0.083% IN NEBU
2.5000 mg | INHALATION_SOLUTION | Freq: Once | RESPIRATORY_TRACT | Status: AC
  Administered 2019-02-01: 2.5 mg via RESPIRATORY_TRACT

## 2019-02-01 NOTE — Telephone Encounter (Signed)
Pt called nurse line stating she picked up the wrong medication yesterday, pantoprazole, using up all of her "medication money." Pt stated the prescriptions called in today at visit, she can not afford, and [facility] would not refund her the money. Please advise.

## 2019-02-01 NOTE — Telephone Encounter (Signed)
Pt lm on nurse to check status of HCTZ.  Attempted to call back but had to leave message.  Asked pt to call back and verify that she is taking HCTZ.  If she is, that medication I son the $4 list and we can send in a refill.  Kayci Belleville, Maryjo Rochester, CMA

## 2019-02-01 NOTE — Progress Notes (Signed)
Patient Name: Regina Leach Date of Birth: May 30, 1966 Date of Visit: 02/01/19 PCP: Freddrick March, MD  Chief Complaint: Intermittent headaches  Subjective: Regina Leach is a pleasant 53 y.o. woman with history of tobacco abuse, severe obstructive pulmonary disease, hypertension, dyslipidemia, obesity and alcohol abuse presenting today for medication refill.  The patient reports she has had significant difficulty affording her inhalers.  She is currently only has her albuterol at home.  She reports for the last 2 weeks has been out of her controller inhaler she has had increased use of her albuterol and worsening chest tightness at times as she exhales.  She denies chest pain, worsening dyspnea on exertion, lower extremity edema, palpitations, cough that is changed, sputum production that is new or changed or fevers.  The patient reports she is out of her HCTZ.  She reports when she stopped taking this medication she typically gets a mild headache.  She endorses a 2 out of 10 headache today.  She denies vision changes, nausea, vomiting, nocturnal headaches or changes in her headaches.  ROS: As above I have reviewed the patient's medical, surgical, family, and social history as appropriate.   Pertinent social history items include the fact that the patient is under significant stress.  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  To relieve her stress she reports she drinks alcohol between 4 and 6 beers a night, every night.  She reports she previously had more alcohol each night.  She is pre-contemplative about quitting.  She smokes about a pack per day.  Vitals:   02/01/19 1105  BP: 136/80  Pulse: 82  Temp: 98.6 F (37 C)  SpO2: 97%   Filed Weights   02/01/19 1105  Weight: 218 lb 9.6 oz (99.2 kg)   HEENT: Sclera anicteric. Dentition is moderate. Appears well hydrated. Neck: Supple Cardiac: Regular rate and rhythm. Normal S1/S2. No murmurs, rubs, or gallops appreciated. Lungs:  Coarse bilateral breath sounds with mild expiratory wheezing.  This improved after nebulizer treatment Abdomen: Normoactive bowel sounds. No tenderness to deep or light palpation. No rebound or guarding.  Extremities: Warm, well perfused without edema.  Skin: Warm, dry Psych: Pleasant and appropriate    [name] was seen today for medication refill.  Diagnoses and all orders for this visit:  Annual physical exam We discussed healthy eating habits, moderate physical activity for 30 minutes 5 times per week (or 150 minutes), safe sex practices, avoiding tobacco products, safe alcohol consumption, and safe driving habits.  We discussed that the below labs will not be covered by her Medicaid family-planning but are recommended.  The patient is under considerable financial stress at this time.  She has applied for financial assistance.  We strategize for ways to find affordable food.  Just reducing alcohol intake.  Discussed smoking cessation.  She is pre-contemplative about both  Essential hypertension -     Basic Metabolic Panel -     Lipid Panel -     hydrochlorothiazide (HYDRODIURIL) 25 MG tablet; Take 1 tablet (25 mg total) by mouth daily. For high blood pressure  Mild persistent chronic asthma with acute exacerbation, discussed tobacco cessation.  The patient is pre-contemplative about quitting.  She reports that her controller inhalers are too expensive and requires samples today.  Able to obtain samples of long-acting antimuscarinic as well as ICS/LABA.  I discussed with the patient that this would not be able to be continued. She reports she has filled out the orange care application  -  albuterol (PROVENTIL) (2.5 MG/3ML) 0.083% nebulizer solution 2.5 mg -     ipratropium (ATROVENT) nebulizer solution 0.5 mg -     Patient given ICS/LABA + LAMA. Discussed that clinic cannot continue to provide samples.   Need for hepatitis C screening test -     Hepatitis c antibody (reflex)  GERD  discussed long-term possible risks of omeprazole therapy and associations of PPI therapy with pneumonia, C. difficile and possibly osteoporosis.  We discussed that the data on C. difficile has recently been reconsidered.  Recommended she consider transition to an H2 blocker in the future -     omeprazole (PRILOSEC) 20 MG capsule; Take 1 capsule (20 mg total) by mouth daily.  HCM  Patient is up-to-date on her Pap.  She is due for colonoscopy.  She would like to consider this in the next few months. Recommended PCV 23 as well.   Terisa Starr, MD  Family Medicine Teaching Service

## 2019-02-01 NOTE — Telephone Encounter (Signed)
Called patient. Supportive listening provided. Patient very upset that pantoprazole rather than omeprazole was prescribed.  I attempted to elicit more description from the patient regarding why pantoprazole does not work for her.  She did allow me to explain that most likely the pantoprazole had been prescribed because omeprazole had been on recall for period of time but no longer is on recall.  She then proceeded to hang up.  I am more than happy to listen and discuss with the patient if she calls back.  Terisa Starr, MD  Family Medicine Teaching Service

## 2019-02-01 NOTE — Patient Instructions (Signed)
It was wonderful to see you today.  Thank you for choosing [facility].   Please call [phone] with any questions about today's appointment.  Please be sure to schedule follow up at the front  desk before you leave today.   Carina Brown, MD  Family Medicine    

## 2019-02-01 NOTE — Telephone Encounter (Signed)
Pt returned call:  1. She states that she does take the HCTZ but has not had it in 2 months. She states that she is now having headaches, swelling and SOB but denies CP.  Asked her to make appt today or tomorrow to discuss since it has been 2 months.  She states " All I need is my medicine, I dont need an appt".  Attempted to explain that it would not be good practice since she is telling me about the symptoms she is having.  Pt is reluctant to appt but finally agrees.  She is upset that she has to do this.  2.  She is requesting that omeprazole be sent in instead of protonix because it works better. Haston Casebolt, Maryjo Rochester, CMA

## 2019-02-02 LAB — LIPID PANEL
Chol/HDL Ratio: 3.7 ratio (ref 0.0–4.4)
Cholesterol, Total: 204 mg/dL — ABNORMAL HIGH (ref 100–199)
HDL: 55 mg/dL (ref >39)
LDL Calculated: 74 mg/dL (ref 0–99)
Triglycerides: 375 mg/dL — ABNORMAL HIGH (ref 0–149)
VLDL Cholesterol Cal: 75 mg/dL — ABNORMAL HIGH (ref 5–40)

## 2019-02-02 LAB — CBC
HEMATOCRIT: 36.6 % (ref 34.0–46.6)
Hemoglobin: 13.2 g/dL (ref 11.1–15.9)
MCH: 31.1 pg (ref 26.6–33.0)
MCHC: 36.1 g/dL — ABNORMAL HIGH (ref 31.5–35.7)
MCV: 86 fL (ref 79–97)
Platelets: 329 10*3/uL (ref 150–450)
RBC: 4.25 x10E6/uL (ref 3.77–5.28)
RDW: 13.5 % (ref 11.7–15.4)
WBC: 5.7 10*3/uL (ref 3.4–10.8)

## 2019-02-02 LAB — BASIC METABOLIC PANEL
BUN / CREAT RATIO: 18 (ref 9–23)
BUN: 12 mg/dL (ref 6–24)
CHLORIDE: 104 mmol/L (ref 96–106)
CO2: 20 mmol/L (ref 20–29)
Calcium: 9.6 mg/dL (ref 8.7–10.2)
Creatinine, Ser: 0.67 mg/dL (ref 0.57–1.00)
GFR calc non Af Amer: 101 mL/min/{1.73_m2} (ref >59)
GFR, EST AFRICAN AMERICAN: 117 mL/min/{1.73_m2} (ref >59)
GLUCOSE: 76 mg/dL (ref 65–99)
POTASSIUM: 4.5 mmol/L (ref 3.5–5.2)
Sodium: 141 mmol/L (ref 134–144)

## 2019-02-02 LAB — HEPATITIS C ANTIBODY (REFLEX)

## 2019-02-02 LAB — HCV COMMENT:

## 2019-02-02 NOTE — Telephone Encounter (Signed)
Called patient to check in after discussion yesterday.  Patient reports she is feeling better.  She apologized for being short on the phone yesterday.  We discussed problem-solving strategies including purchasing just $4 worth of hydrochlorothiazide.  The patient is amenable to this.  She will call back if she has further difficulty with her medications

## 2019-02-04 ENCOUNTER — Other Ambulatory Visit: Payer: Self-pay | Admitting: Family Medicine

## 2019-02-04 NOTE — Telephone Encounter (Signed)
Thanks for the Gerre Pebbles, and it looks like Dr. Manson Passey addressed the omeprazole refill. I appreciate both of your help

## 2019-02-05 ENCOUNTER — Telehealth: Payer: Self-pay | Admitting: Family Medicine

## 2019-02-05 ENCOUNTER — Encounter: Payer: Self-pay | Admitting: Family Medicine

## 2019-02-05 NOTE — Telephone Encounter (Signed)
Attempted to call patient with results. Left generic voicemail to return call.   The patient's cholesterol is elevated- 9/9% 10 year ASCVD. Please help the patient to schedule an appointment with PCP to discuss reducing alcohol and possibly starting statin.  Terisa Starr, MD  Family Medicine Teaching Service

## 2019-02-06 ENCOUNTER — Telehealth: Payer: Self-pay

## 2019-02-06 NOTE — Telephone Encounter (Signed)
Called patient and left a HIPAA compliant message for patient to call office and make and appointment with Dr. Nelson Chimes.  Glennie Hawk, CMA

## 2019-02-09 NOTE — Telephone Encounter (Signed)
Patient returned call. Appointment made. Ples Specter, RN [facility] RN)

## 2019-02-22 ENCOUNTER — Ambulatory Visit: Payer: Medicaid Other | Admitting: Family Medicine

## 2019-02-22 ENCOUNTER — Telehealth: Payer: Self-pay | Admitting: Family Medicine

## 2019-02-22 ENCOUNTER — Other Ambulatory Visit: Payer: Self-pay | Admitting: Family Medicine

## 2019-02-22 MED ORDER — ATORVASTATIN CALCIUM 10 MG PO TABS: 10.0000 mg | ORAL_TABLET | Freq: Every day | ORAL | 3 refills | Status: DC

## 2019-02-22 NOTE — Telephone Encounter (Signed)
Called patient to address high cholesterol for which she has a scheduled appointment this afternoon.  I advised her since this is a non-essential visit and our clinic is taking precautions to avoid risk of exposure due to current coronavirus, I would be alright with her rescheduling this in several weeks or starting a statin medication today.  She is ok with me starting her on atorvastatin 10 mg today.  Discussed side effects of medication with her and have sent this in to her pharmacy. Also counseled on diet/exercise which may help reduce levels.  She was appreciative of the call and will start this.  Advised follow up in 1 month.

## 2019-03-05 ENCOUNTER — Telehealth: Payer: Self-pay | Admitting: *Deleted

## 2019-03-05 NOTE — Telephone Encounter (Signed)
Pt is requesting another sample of Breo.  We do not have any left in the med room.  Will forward to Dr. Raymondo Band to see if we will be expecting more. Jone Baseman, CMA

## 2019-03-22 ENCOUNTER — Other Ambulatory Visit: Payer: Self-pay

## 2019-03-22 ENCOUNTER — Telehealth (INDEPENDENT_AMBULATORY_CARE_PROVIDER_SITE_OTHER): Payer: Self-pay | Admitting: Family Medicine

## 2019-03-22 DIAGNOSIS — J441 Chronic obstructive pulmonary disease with (acute) exacerbation: Secondary | ICD-10-CM

## 2019-03-22 DIAGNOSIS — J449 Chronic obstructive pulmonary disease, unspecified: Secondary | ICD-10-CM

## 2019-03-22 HISTORY — DX: Chronic obstructive pulmonary disease with (acute) exacerbation: J44.1

## 2019-03-22 MED ORDER — DOXYCYCLINE HYCLATE 100 MG PO TABS: ORAL_TABLET | ORAL | 0 refills | Status: DC

## 2019-03-22 NOTE — Assessment & Plan Note (Addendum)
Established problem worsened.  Exacerbation with increase cough with increased sputum and slight increase in shob. Advised to self-quarantine for 7 days or 3 days beyond reolution of symptoms, which ever is longer. Rx Doxycyline 7 day course. Addendum 03/23/19: Rx Robitussin AC 120 mL.  NO RF.  Did not start prdenisone given pt's personal hisotry of GI bleed on prednisone.   Encouraged use of home albuterol nebulizer.   Advised patient that should she develop a fever or worsening shob that she should contact or office immediately or go to urgent/emergency department.

## 2019-03-22 NOTE — Progress Notes (Addendum)
[facility] Telemedicine Visit Attempted to perform Virtual Video Visit but patient was unable to connect after clicking on text message link to [employer].  Patient consented to have visit conducted via telephone.  Encounter participants: Patient: Regina Leach  Patient Location: Home Provider: Tawanna Cooler   Others (if applicable): none  Chief Complaint: cough  HPI:  Cough Onset ~ two weeks ago.  Progressively worsening. Increasing amount of green sputum coming up.  Mild shob. Still able to work. Started as a sore throat.     Denies fever.  Using home nebulizer for albuterol.  Using a Virgel Bouquet that she got back in November 2019. No fever.  No chest pain.  No leg swelling. No calf pain.   Similar to chest cold that was treated in November with antibiotics. Pt reports a GI bleed with use of prednisone  Denies exposure to known CoViD case or suspected CoViD case.     SH: patient actively smokes tobacco ROS:  Good appetite No myalgias  Pertinent PMHx:  Severe obstructive lung disease on Spirometry 10/2018  Exam:  Respiratory: speaking in full sentence, no audible wheeze, voice prosodic Psych: affect by voice modulates appropriately to social cues.  Language is concrete and appropriate to situation  Assessment/Plan:  COPD exacerbation (HCC) Established problem worsened.  Exacerbation with increase cough with increased sputum and slight increase in shob. Advised to self-quarantine for 7 days or 3 days beyond reolution of symptoms, which ever is longer. Rx Doxycyline 7 day course Did not start prdenisone given pt's personal hisotry of GI bleed on prednisone.   Encouraged use of home albuterol nebulizer.    Obstructive lung disease (HCC) Established problem worsened.   Patient unable to afford controller medications.  Her Medicaid is for family planning only. Will ask Ms Bonnielee Haff, if Ms Norvell qualifies for any low-income insurance subsidies,  especially ones that may help with inhaler medications.   Provided sample of Trelegy today.  Pt to pick up from front office.  Pt realizes that using samples for maintenance treatments is not a reliable way to treat her COPD.  Advised smoking cessation: Pt is thinking about it.  Not interested in quiting at this time.  Let her know we are available to assist when she decides to quit.     Time spent on phone with patient: 21 minutes

## 2019-03-22 NOTE — Assessment & Plan Note (Deleted)
10/2018 Spirometry c/w severe obstruction

## 2019-03-22 NOTE — Assessment & Plan Note (Signed)
Established problem worsened.   Patient unable to afford controller medications.  Her Medicaid is for family planning only. Will ask Ms Bonnielee Haff, if Ms Mollet qualifies for any low-income insurance subsidies, especially ones that may help with inhaler medications.   Provided sample of Trelegy today.  Pt to pick up from front office.  Pt realizes that using samples for maintenance treatments is not a reliable way to treat her COPD.  Advised smoking cessation: Pt is thinking about it.  Not interested in quiting at this time.  Let her know we are available to assist when she decides to quit.

## 2019-03-23 ENCOUNTER — Telehealth: Payer: Self-pay

## 2019-03-23 MED ORDER — GUAIFENESIN-CODEINE 100-10 MG/5ML PO SOLN: 10.0000 mL | Freq: Four times a day (QID) | ORAL | 0 refills | Status: DC | PRN

## 2019-03-23 NOTE — Addendum Note (Signed)
Addended byPerley Jain, Damario Gillie D on: 03/23/2019 12:05 PM   Modules accepted: Orders

## 2019-03-23 NOTE — Telephone Encounter (Signed)
RX Robitussin AC 120 mL zero RF sent to pharmacy

## 2019-03-23 NOTE — Telephone Encounter (Signed)
Pt called nurse line stating she forgot to ask her telemedicine provider to send her in some cough syrup. The patient asked specifically for cough medicine with codeine. Will forward to provider who spoke with her.

## 2019-04-19 ENCOUNTER — Other Ambulatory Visit (HOSPITAL_COMMUNITY): Payer: Self-pay | Admitting: *Deleted

## 2019-04-19 DIAGNOSIS — Z1231 Encounter for screening mammogram for malignant neoplasm of breast: Secondary | ICD-10-CM

## 2019-04-24 ENCOUNTER — Encounter: Payer: Self-pay | Admitting: Family Medicine

## 2019-04-24 ENCOUNTER — Ambulatory Visit (INDEPENDENT_AMBULATORY_CARE_PROVIDER_SITE_OTHER): Payer: Self-pay | Admitting: Family Medicine

## 2019-04-24 ENCOUNTER — Other Ambulatory Visit: Payer: Self-pay

## 2019-04-24 VITALS — BP 178/92 | HR 67

## 2019-04-24 DIAGNOSIS — E785 Hyperlipidemia, unspecified: Secondary | ICD-10-CM

## 2019-04-24 DIAGNOSIS — I10 Essential (primary) hypertension: Secondary | ICD-10-CM

## 2019-04-24 DIAGNOSIS — Z720 Tobacco use: Secondary | ICD-10-CM

## 2019-04-24 DIAGNOSIS — R5383 Other fatigue: Secondary | ICD-10-CM

## 2019-04-24 MED ORDER — NICOTINE 21 MG/24HR TD PT24: 21.0000 mg | MEDICATED_PATCH | Freq: Every day | TRANSDERMAL | 0 refills | Status: DC

## 2019-04-24 NOTE — Patient Instructions (Addendum)
Check your blood pressure 2 times per week Message [place] if the top number is higher than 140 or the bottom number is greater than 90  It was wonderful to see you today.  Thank you for choosing [facility].   Please call [phone] with any questions about today's appointment.  Please be sure to schedule follow up at the front  desk before you leave today.   Terisa Starr, MD  Family Medicine   I will call you with results  Please go to the lab today

## 2019-04-24 NOTE — Progress Notes (Signed)
Patient Name: Regina Leach Date of Birth: 1966-09-18 Date of Visit: 04/24/19 PCP: Freddrick March, MD  Chief Complaint: feeling stressed   Subjective: Regina Leach is a pleasant 53 y.o. with medical history significant for dental hypertension, tobacco abuse, dyslipidemia and alcohol abuse presenting today for not feeling right.  The patient reports that she believes her vitamin D level is low as she has been feeling fatigued.  She reports this all started around the time the coronavirus pandemic started.  Around the time she started feeling very stressed overwhelmed and irritable.  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  Her daughter is also disabled and she cares for her throughout the day.  She endorses poor sleep fatigue and irritability.  She denies thoughts of hurting herself or others.  She reports she 5 strength in prior.  She specifically denies dizziness, palpitations, nausea, vomiting, diarrhea, chest pain or dyspnea.  She feels that she is not doing well because her vitamin D or iron may be low.  She would like to know what her levels are and restart these medications.  In regards to patient's blood pressure, she reports she took her hydrochlorothiazide this morning.  She has been taking her atorvastatin for her dyslipidemia in the morning.  She denies any side effects of these medications.  She denies headaches, chest pain or dyspnea.  She has no lower extremity edema.  She reports that she thinks stress is the cause of her increase in her blood pressure.  The patient continues to smoke 1 pack/day.  She is interested in quitting in the future.  She would like the patches available to quit.  She continues to drink.  He drinks a 40 ounce of beer every single day when she returns from work.  On Fridays and on the weekends she often consumes more than this.  She is not interested in discontinuing alcohol at this time.      ROS: as above  ROS  I have  reviewed the patient's medical, surgical, family, and social history as appropriate.   Vitals:   04/24/19 0853  BP: (!) 178/92  Pulse: 67  SpO2: 98%   There were no vitals filed for this visit.  HEENT: Sclera anicteric. Dentition is moderate. Appears well hydrated. Neck: Supple Cardiac: Regular rate and rhythm. Normal S1/S2. No murmurs, rubs, or gallops appreciated. Lungs: Clear bilaterally to ascultation.  Extremities: Warm, well perfused without edema.  Skin: Warm, dry Psych: Pleasant and appropriate   Repeat BP 160/90   Regina Leach was seen today for feels bad.  Diagnoses and all orders for this visit:  Stress reaction supportive listening provided.  I offered therapy resources and clinical social work consultation.  The patient declined this.  We discussed options for improving sleep and using the calm app.  She will call if her mood worsens or if her sleep does not improve.  We discussed tobacco cessation and alcohol cessation.  Essential hypertension commended addition of amlodipine 5 mg today.  The patient is hesitant to do this due to her increasing stress.  She will monitor her blood pressure twice per week at home for the next 2 weeks.  She is to call if her blood pressure values are greater than 140 systolic or greater than 90 diastolic.  Would recommend a high calcium channel blocker based upon her risk factors if BP continues to be elevated.  Tobacco abuse -     We are out of stock  of the pneumonia vacs vaccine today.  She is interested in this vaccine in the future. -     nicotine (NICODERM CQ) 21 mg/24hr patch; Place 1 patch (21 mg total) onto the skin daily.  Fatigue, along with elevated blood pressure.  We discussed the limited evidence for the association between low vitamin D and fatigue.  We discussed the evidence suggesting that vitamin D supplementation may not improve her fatigue and does not appear to improve bone mineral density.  I recommended the labs below  given her symptoms.  If her CBC is reduced to order a ferritin level recommend restarting her iron therapy.  Recommended colonoscopy.  We will further encourage colonoscopy showed her anemia return. -     Ferritin -     CBC -     TSH  Dyslipidemia repeat as she has started statin, encouraged consideration of cessation of EtOH.  -     Lipid Panel   Terisa Starrarina Mariano Doshi, MD  Family Medicine Teaching Service

## 2019-04-25 ENCOUNTER — Encounter: Payer: Self-pay | Admitting: Family Medicine

## 2019-04-25 LAB — LIPID PANEL
Chol/HDL Ratio: 3.3 ratio (ref 0.0–4.4)
Cholesterol, Total: 209 mg/dL — ABNORMAL HIGH (ref 100–199)
HDL: 63 mg/dL (ref >39)
LDL Calculated: 90 mg/dL (ref 0–99)
Triglycerides: 279 mg/dL — ABNORMAL HIGH (ref 0–149)
VLDL Cholesterol Cal: 56 mg/dL — ABNORMAL HIGH (ref 5–40)

## 2019-04-25 LAB — FERRITIN: Ferritin: 311 ng/mL — ABNORMAL HIGH (ref 15–150)

## 2019-04-25 LAB — CBC
Hematocrit: 35.3 % (ref 34.0–46.6)
Hemoglobin: 12 g/dL (ref 11.1–15.9)
MCH: 28.9 pg (ref 26.6–33.0)
MCHC: 34 g/dL (ref 31.5–35.7)
MCV: 85 fL (ref 79–97)
Platelets: 320 10*3/uL (ref 150–450)
RBC: 4.15 x10E6/uL (ref 3.77–5.28)
RDW: 13.9 % (ref 11.7–15.4)
WBC: 5.7 10*3/uL (ref 3.4–10.8)

## 2019-04-25 LAB — TSH: TSH: 2.12 u[IU]/mL (ref 0.450–4.500)

## 2019-04-25 NOTE — Progress Notes (Signed)
Called with results. Patient has been taking OTC iron tablets. Recommended discontinuing. Repeat ferritin + hepatic panel at follow up (likely iatrogenic due to iron pills OTC, could also be related to alcoholic liver disease, much less likely hemochromatosis).  Terisa Starr, MD  Family Medicine Teaching Service

## 2019-05-03 ENCOUNTER — Other Ambulatory Visit: Payer: Self-pay

## 2019-05-03 ENCOUNTER — Ambulatory Visit (HOSPITAL_COMMUNITY)
Admission: RE | Admit: 2019-05-03 | Discharge: 2019-05-03 | Disposition: A | Discharge status: Home / Self Care | Payer: No Typology Code available for payment source | Source: Ambulatory Visit | Attending: Obstetrics and Gynecology | Admitting: Obstetrics and Gynecology

## 2019-05-03 ENCOUNTER — Encounter (HOSPITAL_COMMUNITY): Payer: Self-pay

## 2019-05-03 VITALS — BP 132/84 | Temp 98.2°F

## 2019-05-03 DIAGNOSIS — Z1239 Encounter for other screening for malignant neoplasm of breast: Secondary | ICD-10-CM

## 2019-05-03 NOTE — Progress Notes (Signed)
No complaints today.   Pap Smear: Pap smear not completed today. Last Pap smear was 01/13/2018 at [facility] and normal with negative HPV. Per patient has no history of an abnormal Pap smear. Last Pap smear result in Epic.  Physical exam: Breasts Breasts symmetrical. No skin abnormalities bilateral breasts. No nipple retraction bilateral breasts. No nipple discharge bilateral breasts. No lymphadenopathy. No lumps palpated bilateral breasts. No complaints of pain or tenderness on exam. Referred patient to the [facility] for a screening mammogram. Appointment scheduled for Tuesday, May 08, 2019 at 0940.        Pelvic/Bimanual No Pap smear completed today since last Pap smear and HPV typing was 01/13/2018. Pap smear not indicated per BCCCP guidelines.   Smoking History: Patient is a current smoker. Discussed smoking cessation with patient. Referred to the [facility] Quitline and gave information for free smoking cessation classes at [facility].  Patient Navigation: Patient education provided. Access to services provided for patient through BCCCP program.   Colorectal Cancer Screening: Per patient has never had a colonoscopy completed. No complaints today.   Breast and Cervical Cancer Risk Assessment: Patient has a family history of a maternal aunt having breast cancer. Patient has no known genetic mutations or history of radiation treatment to the chest before age 11. Patient has no history of cervical dysplasia, immunocompromised, or DES exposure in-utero.  Risk Assessment    Risk Scores      05/03/2019   Last edited by: Lynnell Dike, LPN   5-year risk: 1 %   Lifetime risk: 6.5 %

## 2019-05-03 NOTE — Patient Instructions (Addendum)
Explained breast self awareness with Valentina Gu. Patient did not need a Pap smear today due to last Pap smear and HPV typing was 01/13/2018. Let her know BCCCP will cover Pap smears and HPV typing every 5 years unless has a history of abnormal Pap smears. Referred patient to the [facility] for a screening mammogram. Appointment scheduled for Tuesday, May 08, 2019 at 0940.    Patient aware of appointment and will be there. Let patient know the Breast Center will follow up with her within the next couple weeks after appointment with results by letter or phone. Discussed smoking cessation with patient. Referred to the [facility] Quitline and gave information for free smoking cessation classes at [facility]. Keyna Fallen Ehlert verbalized understanding.  Parag Dorton, Kathaleen Maser, RN 2:52 PM

## 2019-05-07 ENCOUNTER — Encounter (HOSPITAL_COMMUNITY): Payer: Self-pay | Admitting: *Deleted

## 2019-05-07 ENCOUNTER — Telehealth: Payer: Self-pay

## 2019-05-07 NOTE — Telephone Encounter (Signed)
Left message with patient about scheduling appointment with the [facility] program. Left name and number for her to call back.

## 2019-05-08 ENCOUNTER — Other Ambulatory Visit: Payer: Self-pay

## 2019-05-08 ENCOUNTER — Ambulatory Visit
Admission: RE | Admit: 2019-05-08 | Discharge: 2019-05-08 | Disposition: A | Discharge status: Home / Self Care | Payer: No Typology Code available for payment source | Source: Ambulatory Visit | Attending: Obstetrics and Gynecology | Admitting: Obstetrics and Gynecology

## 2019-05-08 DIAGNOSIS — Z1231 Encounter for screening mammogram for malignant neoplasm of breast: Secondary | ICD-10-CM

## 2019-05-21 ENCOUNTER — Other Ambulatory Visit: Payer: Self-pay | Admitting: Family Medicine

## 2019-05-21 DIAGNOSIS — E559 Vitamin D deficiency, unspecified: Secondary | ICD-10-CM

## 2019-05-23 ENCOUNTER — Other Ambulatory Visit: Payer: Self-pay

## 2019-05-23 ENCOUNTER — Telehealth (INDEPENDENT_AMBULATORY_CARE_PROVIDER_SITE_OTHER): Payer: Self-pay | Admitting: Family Medicine

## 2019-05-23 ENCOUNTER — Telehealth: Payer: Self-pay | Admitting: *Deleted

## 2019-05-23 DIAGNOSIS — J449 Chronic obstructive pulmonary disease, unspecified: Secondary | ICD-10-CM

## 2019-05-23 DIAGNOSIS — J441 Chronic obstructive pulmonary disease with (acute) exacerbation: Secondary | ICD-10-CM

## 2019-05-23 DIAGNOSIS — Z20822 Contact with and (suspected) exposure to covid-19: Secondary | ICD-10-CM

## 2019-05-23 MED ORDER — DOXYCYCLINE HYCLATE 100 MG PO TABS: ORAL_TABLET | ORAL | 0 refills | Status: DC

## 2019-05-23 NOTE — Progress Notes (Signed)
[place] Telemedicine Visit  Patient consented to have virtual visit. Method of visit: Telephone  Encounter participants: Patient: Regina Leach - located at home  Provider: [place] Calianne Larue - located at [facility] Others (if applicable): n/a  Chief Complaint: cough  HPI:  Cough: Patient reports that 2 times out of the year, she gets an infection in her chest.  Reports that for the past week she has a cough and doesn't feel good. She was out doing errands, and last week it started. No significant SOB. Using Trelegy inhaler daily. Been using albuterol one-2 times yesterday.  Reports that she had complete resolution of her COPD exacerbation in April.  States that she continues to smoke  Denies any fevers, chills, no loss of taste or smell and no known contacts with COVID-19.    ROS: per HPI  Pertinent PMHx: COPD, HTN, tobacco use disorder  Exam:  Respiratory: able to speak in complete sentences  Assessment/Plan:  Obstructive lung disease ([place]) Exacerbation with increase cough with increased sputum and slight increase in sob. We will obtain COVID-19 testing and advised to self-quarantine for 7 days or until test comes back negative. Rx Doxycyline 7 day course. Rx Robitussin AC 120 mL as this to help patient with resolution of symptoms previously. Did not start prdenisone given pt's personal hisotry of GI bleed on prednisone.   Encouraged use of home albuterol nebulizer to continue with her Trelegy daily  Advised patient that should she develop a fever or worsening sob that she should contact or office immediately or go to urgent/emergency department.     Time spent during visit with patient: 11 minutes

## 2019-05-23 NOTE — Telephone Encounter (Signed)
Patient scheduled for covid testing tomorrow at 8:15 @ [facility]. Instructions given and lab ordered

## 2019-05-23 NOTE — Telephone Encounter (Signed)
-----   Message from Martin[place] Shirley, DO sent at 05/23/2019  2:27 PM EDT ----- Patient needs COVID testing.  COVID Drive-Up Test Referral Criteria  Patient age: 53 y.o.  Symptoms: Cough and Shortness of breath  Underlying Conditions: HTN  Is the patient a first responder? No  Does the patient live or work in a high risk or high density environment: No  Is the patient a COVID convalescent patient who is 14-28 days symptom-free and interested in donating plasma for use as a therapeutic product? No

## 2019-05-24 ENCOUNTER — Telehealth: Payer: Self-pay

## 2019-05-24 ENCOUNTER — Other Ambulatory Visit: Payer: Self-pay | Admitting: Family Medicine

## 2019-05-24 ENCOUNTER — Other Ambulatory Visit: Payer: Self-pay

## 2019-05-24 DIAGNOSIS — Z20822 Contact with and (suspected) exposure to covid-19: Secondary | ICD-10-CM

## 2019-05-24 MED ORDER — GUAIFENESIN-CODEINE 100-10 MG/5ML PO SOLN: 10.0000 mL | Freq: Four times a day (QID) | ORAL | 0 refills | Status: DC | PRN

## 2019-05-24 NOTE — Telephone Encounter (Signed)
Cough medication has been sent in.

## 2019-05-24 NOTE — Assessment & Plan Note (Signed)
Exacerbation with increase cough with increased sputum and slight increase in sob. We will obtain COVID-19 testing and advised to self-quarantine for 7 days or until test comes back negative. Rx Doxycyline 7 day course. Rx Robitussin AC 120 mL as this to help patient with resolution of symptoms previously. Did not start prdenisone given pt's personal hisotry of GI bleed on prednisone.   Encouraged use of home albuterol nebulizer to continue with her Trelegy daily  Advised patient that should she develop a fever or worsening sob that she should contact or office immediately or go to urgent/emergency department.

## 2019-05-24 NOTE — Telephone Encounter (Signed)
Pt called and stated that she was supposed to get cough medication called into her pharmacy on 05-24-19, pharmacy informed pt that they haven't received any prescription for patient. Please give pt a call back.

## 2019-05-24 NOTE — Telephone Encounter (Signed)
Informed patient of RX at pharmacy.  Patient verbalized understanding.  Regina Leach, [place]

## 2019-05-28 ENCOUNTER — Ambulatory Visit: Payer: No Typology Code available for payment source

## 2019-05-28 LAB — NOVEL CORONAVIRUS, NAA: SARS-CoV-2, NAA: NOT DETECTED

## 2019-05-30 ENCOUNTER — Ambulatory Visit: Payer: No Typology Code available for payment source

## 2019-07-02 ENCOUNTER — Telehealth: Payer: Self-pay | Admitting: Family Medicine

## 2019-07-02 NOTE — Telephone Encounter (Signed)
Pt calls again to give more info on letter needed.   Patient sounds very congested/coughing and states that she does not feel well ( she declines an appt at this time).  She does not think that she should go back to work at this time and would like that included in the letter.  Christen Bame, CMA

## 2019-07-02 NOTE — Telephone Encounter (Signed)
Pt called to see if she could get a letter stating that she had a COVID-19 test and what her results were. Please give pt a call back.

## 2019-07-03 ENCOUNTER — Telehealth (INDEPENDENT_AMBULATORY_CARE_PROVIDER_SITE_OTHER): Payer: Self-pay | Admitting: Family Medicine

## 2019-07-03 ENCOUNTER — Other Ambulatory Visit: Payer: Self-pay

## 2019-07-03 ENCOUNTER — Encounter: Payer: Self-pay | Admitting: Family Medicine

## 2019-07-03 DIAGNOSIS — J069 Acute upper respiratory infection, unspecified: Secondary | ICD-10-CM

## 2019-07-03 DIAGNOSIS — J209 Acute bronchitis, unspecified: Secondary | ICD-10-CM

## 2019-07-03 DIAGNOSIS — J440 Chronic obstructive pulmonary disease with acute lower respiratory infection: Secondary | ICD-10-CM

## 2019-07-03 MED ORDER — BENZONATATE 100 MG PO CAPS: 100.0000 mg | ORAL_CAPSULE | Freq: Two times a day (BID) | ORAL | 0 refills | Status: DC | PRN

## 2019-07-03 NOTE — Progress Notes (Signed)
[place] Telemedicine Visit  Patient consented to have virtual visit. Method of visit: Telephone  Encounter participants: Patient: Regina Leach - located at home Provider: [place] - located at office Others (if applicable): n/a  Chief Complaint: cough  HPI: Patient complaining of 1 week of worsening cough and congestion without fevers or chills.  Questionable exposure to someone that had exposure to coronavirus.  Person that was "exposed" remains asymptomatic.  Patient similar symptoms a month ago and was tested for coronavirus.  This was negative.  Patient feels that this is not worsening but a new onset of "bronchitis".  She also reports history of asthma and uses inhaler for this.  She has had a history of inhaler once or twice current episode of illness.  Patient smokes quarter pack per day.  ROS: per HPI  Pertinent PMHx: Denies.,  "Asthma"/COPD  Exam:  Respiratory: Active coughing, does not have any positive, sounds congested  Assessment/Plan: Acute on chronic bronchitis versus viral illness Patient with difficulty breathing and having to use albuterol inhaler some, also active cough.  Questionable exposure COVID-19.  Given symptoms will test for coronavirus and treat coservatively.  Patient does not need refill of inhaler.  Will use Tessalon Perles for cough.  Strict return precautions given.   Time spent during visit with patient: 6 minutes

## 2019-07-03 NOTE — Telephone Encounter (Signed)
Talked to Regina Leach and patient has not had another Covid testing to her knowledge.   Have not called patient to offer a virtual visit as she declined this option with Regina Leach.   [name], [place]

## 2019-07-03 NOTE — Progress Notes (Signed)
Attempted to call pt at 1:57 to remind them of their telephone visit. LVM Salvatore Marvel, CMA

## 2019-07-03 NOTE — Telephone Encounter (Signed)
I see that the patient had a COVID test completed over a month ago on 6/18 (none more recent), which was negative with additional documentation that this result was discussed with the patient.  Has she had a more recent COVID test that she is referring to?  Patriciaann Clan, DO

## 2019-07-03 NOTE — Telephone Encounter (Signed)
Pt returned call.  She now states that she just needs a letter stating that she was tested in 6/18 ane was negative.  She does not need to be taken out of work.  Advised that if she needs a more in depth letter she will need to make a virtual appt.  Pt agreeable.  Christen Bame, CMA

## 2019-07-04 ENCOUNTER — Other Ambulatory Visit: Payer: Self-pay

## 2019-07-04 DIAGNOSIS — Z20822 Contact with and (suspected) exposure to covid-19: Secondary | ICD-10-CM

## 2019-07-04 NOTE — Telephone Encounter (Signed)
Note written and placed at the front desk for pick up.   Thank you!  Patriciaann Clan, DO

## 2019-07-04 NOTE — Telephone Encounter (Signed)
Informed patient of letter for pick up.  Regina Leach, [place]

## 2019-07-06 LAB — NOVEL CORONAVIRUS, NAA: SARS-CoV-2, NAA: NOT DETECTED

## 2019-07-11 ENCOUNTER — Encounter: Payer: Self-pay | Admitting: Family Medicine

## 2019-07-11 ENCOUNTER — Telehealth: Payer: Self-pay | Admitting: Family Medicine

## 2019-07-11 NOTE — Telephone Encounter (Signed)
Pt is calling and would like to have a letter written for her employer stating that her covid test was negative and she may return to work.  Pt would like for someone to call her when the letter is ready to be picked up at the front desk.

## 2019-07-13 NOTE — Telephone Encounter (Signed)
Completed (all thanks to [place]), in front area. Pt aware.

## 2019-07-25 ENCOUNTER — Ambulatory Visit: Payer: Medicaid Other | Admitting: Family Medicine

## 2019-07-25 ENCOUNTER — Ambulatory Visit: Payer: No Typology Code available for payment source | Admitting: Family Medicine

## 2019-08-20 ENCOUNTER — Ambulatory Visit: Payer: No Typology Code available for payment source | Admitting: Family Medicine

## 2019-08-27 ENCOUNTER — Telehealth: Payer: Self-pay | Admitting: *Deleted

## 2019-08-27 ENCOUNTER — Other Ambulatory Visit: Payer: Self-pay | Admitting: *Deleted

## 2019-08-27 DIAGNOSIS — I10 Essential (primary) hypertension: Secondary | ICD-10-CM

## 2019-08-27 MED ORDER — BREO ELLIPTA 200-25 MCG/INH IN AEPB: 1.0000 | INHALATION_SPRAY | Freq: Every day | RESPIRATORY_TRACT | 0 refills | Status: DC

## 2019-08-27 MED ORDER — OMEPRAZOLE 20 MG PO CPDR: 20.0000 mg | DELAYED_RELEASE_CAPSULE | Freq: Every day | ORAL | 1 refills | Status: DC

## 2019-08-27 MED ORDER — HYDROCHLOROTHIAZIDE 25 MG PO TABS: 25.0000 mg | ORAL_TABLET | Freq: Every day | ORAL | 3 refills | Status: DC

## 2019-08-27 NOTE — Telephone Encounter (Signed)
Pt states that Dr. Valentina Lucks gave her samples of inhalers before but she cant remember the names.  She wants to know if she can request more since the seasons are changing and her asthma is  "acting up". Christen Bame, CMA

## 2019-08-27 NOTE — Telephone Encounter (Signed)
Noted and agree. 

## 2019-08-27 NOTE — Telephone Encounter (Signed)
Patient called requesting call back - for inhaler which has helped her breathing in the past with fall allergy and season change respiratory difficulty.   Patient says "breathing has been more difficult" lately but denies any noctural awakening from dyspea.  She states this breathing is very similar to last year.  Denies any additional symptoms (fever).   Agreed to restart/supply 1 month of BREO elipta. Samples left for pick-up at the front desk.   Medication Samples have been provided to the patient.  Drug name: BREO 200/25  Qty: 2 inh (28 days)  LOT: [id number]  Exp.Date: 02/03/2020  The patient has been instructed regarding the correct time, dose, and frequency of taking this medication, including desired effects and most common side effects.   Janeann Forehand 12:15 PM 08/27/2019  Asked to schedule follow-up with Dr. Higinio Plan for additional work-up/evaluation.

## 2019-08-27 NOTE — Telephone Encounter (Signed)
Please schedule patient for hypertension follow up. Thank you!   Best,  Dr Higinio Plan

## 2019-08-28 NOTE — Telephone Encounter (Signed)
Pt scheduled and informed of appt. Hebah Bogosian, CMA  

## 2019-08-29 NOTE — Telephone Encounter (Signed)
Thank you Dr Valentina Lucks for your help! She scheduled with myself on 9/30.   Patriciaann Clan, DO

## 2019-09-05 ENCOUNTER — Other Ambulatory Visit: Payer: Self-pay

## 2019-09-05 ENCOUNTER — Telehealth (INDEPENDENT_AMBULATORY_CARE_PROVIDER_SITE_OTHER): Payer: Self-pay | Admitting: Family Medicine

## 2019-09-05 ENCOUNTER — Ambulatory Visit: Payer: No Typology Code available for payment source | Admitting: Family Medicine

## 2019-09-05 DIAGNOSIS — D509 Iron deficiency anemia, unspecified: Secondary | ICD-10-CM

## 2019-09-05 DIAGNOSIS — K219 Gastro-esophageal reflux disease without esophagitis: Secondary | ICD-10-CM

## 2019-09-05 DIAGNOSIS — R05 Cough: Secondary | ICD-10-CM

## 2019-09-05 DIAGNOSIS — N939 Abnormal uterine and vaginal bleeding, unspecified: Secondary | ICD-10-CM | POA: Insufficient documentation

## 2019-09-05 DIAGNOSIS — Z720 Tobacco use: Secondary | ICD-10-CM

## 2019-09-05 DIAGNOSIS — I10 Essential (primary) hypertension: Secondary | ICD-10-CM

## 2019-09-05 DIAGNOSIS — R059 Cough, unspecified: Secondary | ICD-10-CM

## 2019-09-05 DIAGNOSIS — J069 Acute upper respiratory infection, unspecified: Secondary | ICD-10-CM

## 2019-09-05 MED ORDER — BENZONATATE 100 MG PO CAPS: 100.0000 mg | ORAL_CAPSULE | Freq: Two times a day (BID) | ORAL | 0 refills | Status: DC | PRN

## 2019-09-05 MED ORDER — OMEPRAZOLE 20 MG PO CPDR: 20.0000 mg | DELAYED_RELEASE_CAPSULE | Freq: Every day | ORAL | 1 refills | Status: DC

## 2019-09-05 MED ORDER — HYDROCHLOROTHIAZIDE 25 MG PO TABS: 25.0000 mg | ORAL_TABLET | Freq: Every day | ORAL | 3 refills | Status: DC

## 2019-09-05 NOTE — Assessment & Plan Note (Signed)
Last BP recorded in office 178/92 (04/24/2019). -Continue HCTZ -Consider adding antihypertensive for better control if needed -BP check at next appointment

## 2019-09-05 NOTE — Assessment & Plan Note (Signed)
-  Stop taking iron supplement -CBC 04/24/2019 H/H: 12.0/35.3 -Repeat CBC at next office visit

## 2019-09-05 NOTE — Assessment & Plan Note (Signed)
Continue omeprazole 

## 2019-09-05 NOTE — Assessment & Plan Note (Signed)
Needs in office work up. -Make an office appointment for next week -Stop lipitor and iron  -Consider pelvic ultrasound and endometrial biopsy -Obtain CBC and CMP -Follow up PRN until next visit if bleeding increases

## 2019-09-05 NOTE — Progress Notes (Signed)
[place] Telemedicine Visit  Patient consented to have virtual visit. Method of visit: Telephone  Encounter participants: Patient: GAYANNE PRESCOTT - located at home Provider: Gerlene Fee - located at home office  Chief Complaint: Abnormal Uterine Bleeding  HPI: Mrs. Bovard has been experiencing abnormal uterine bleeding since starting Lipitor in March.  She has been menopausal since 2018. She says she spoke with Dr. Owens Shark who told her to stop taking the Lipitor if her bleeding resumed.  She only took Lipitor for a month and started in April but continued to have abnormal uterine bleeding every month for duration of 2 weeks on and 2 weeks off.  Her last CBC was done 04/24/2019 H/H  12.0/35.3.  She is endorsing fatigue and has taken iron pills in the past but is not.  Today she does continue to have some spotting.  Her other concerns are her coughs and when she would like her cough medicine refilled.  Today we have also refilled her hydrochlorothiazide last blood pressure in office was 178/92 this also needs to be addressed at her next visit.  We also refilled her omeprazole today.  Her only other issue in which she plans to make an office visit is her left shoulder she says is cracking.  For now we will focus on the abnormal uterine bleeding and work this issue up in office.  She states she will try to make an appointment for next week because she does not want to come in the office due to the high rise of coronavirus.    ROS: per HPI  Pertinent PMHx:  Patient Active Problem List   Diagnosis Date Noted  . COPD exacerbation ([place]) 03/22/2019  . Asthma with acute exacerbation 10/13/2018  . Vitamin D deficiency 11/17/2015  . Obstructive lung disease ([place]) 03/26/2015  . HLD (hyperlipidemia) 03/26/2015  . History of alcohol abuse 07/03/2014  . Anemia, iron deficiency 08/30/2013  . GERD 01/27/2009  . 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 Essential hypertension, benign  12/20/2008  . Morbid obesity with BMI of 40.0-44.9, adult ([place]) 02/02/2007  . Tobacco abuse 02/02/2007    Exam:  General: Sounds well with occasional cough, does not sound to be in acute distress. Age appropriate. Psych: normal affect   Assessment/Plan:  Abnormal uterine and vaginal bleeding, unspecified Needs in office work up. -Make an office appointment for next week -Stop lipitor and iron  -Consider pelvic ultrasound and endometrial biopsy -Obtain CBC and CMP -Follow up PRN until next visit if bleeding increases  Anemia, iron deficiency -Stop taking iron supplement -CBC 04/24/2019 H/H: 12.0/35.3 -Repeat CBC at next office visit  GERD -Continue omeprazole  Tobacco abuse -Says she continues to have a few cigarettes of [place] occasionally -Continues cessation medications   Cough -Continue tessalon for symptomatic relief.  Essential hypertension, benign Last BP recorded in office 178/92 (04/24/2019). -Continue HCTZ -Consider adding antihypertensive for better control if needed -BP check at next appointment    Time spent during visit with patient: 25 minutes  Gerlene Fee, [place] PGY-1

## 2019-09-05 NOTE — Assessment & Plan Note (Signed)
-  Continue tessalon for symptomatic relief.

## 2019-09-05 NOTE — Assessment & Plan Note (Signed)
-  Says she continues to have a few cigarettes of [place] occasionally -Continues cessation medications

## 2019-09-12 ENCOUNTER — Other Ambulatory Visit: Payer: Self-pay

## 2019-09-12 ENCOUNTER — Telehealth: Payer: No Typology Code available for payment source | Admitting: Family Medicine

## 2019-09-12 DIAGNOSIS — N939 Abnormal uterine and vaginal bleeding, unspecified: Secondary | ICD-10-CM

## 2019-09-12 NOTE — Progress Notes (Signed)
Called Regina Leach for her scheduled telemedicine appointment this morning.  She states she was not aware that she had a visit scheduled.  Additionally noted that she spoke with Dr. Janus Molder on 9/30 about abnormal uterine bleeding in a post menopausal woman and additionally note that her hypertension was uncontrolled during last office visit in May.  Discussed that she would benefit from scheduling in an office visit at the end of this week or early next week to address these problems most efficiently.  Patient to be calling the clinic to schedule an appointment this morning for early next week per her preference. Discussed ED precautions including pre-/syncopal, CP, SOB, visual changes.   Patriciaann Clan, DO

## 2019-10-12 ENCOUNTER — Ambulatory Visit (INDEPENDENT_AMBULATORY_CARE_PROVIDER_SITE_OTHER): Payer: Self-pay

## 2019-10-12 ENCOUNTER — Telehealth: Payer: Self-pay

## 2019-10-12 ENCOUNTER — Other Ambulatory Visit: Payer: Self-pay

## 2019-10-12 DIAGNOSIS — Z23 Encounter for immunization: Secondary | ICD-10-CM

## 2019-10-12 NOTE — Telephone Encounter (Signed)
Yes!  However I really would like to have her schedule an in office appointment to discuss her COPD control, hypertension, and abnormal bleeding she was having back in October.  Patriciaann Clan, DO

## 2019-10-12 NOTE — Progress Notes (Signed)
Patient presents in nurse clinic for Flu vaccine. Vaccine given LD, site unremarkable.  

## 2019-10-12 NOTE — Telephone Encounter (Signed)
Patient calls nurse line stating she has one Breo inhaler left. Patient is wondering if she can come by the office next week to pick up another sample?

## 2019-10-12 NOTE — Telephone Encounter (Signed)
Called patient and she states that she is no longer bleeding and that her blood pressure is under control because she takes [employer] supplements.  Patient states that she does not need an appointment at this time but she needs the [facility] which keeps down the inflammation in her chest.  .Regina Leach, [place]

## 2019-10-18 NOTE — Telephone Encounter (Signed)
Called patient to discuss Breo and abnormal bleeding.  States that her vaginal bleeding only lasted from March to April and has had not any further since.  Additionally states that it been a less than a year since she last menstrual cycle when this came on, rather than 2018.  We will keep an eye on this and have a low threshold to have a pelvic ultrasound and endometrial biopsy performed.  In terms of Breo, please let her know if we have any samples to provide for her to pick up today.  Discussed getting a sample now and working towards seeing if she can qualify for the orange card/financial assistance for long-term medication coverage.    States she turned in orange card paperwork to [place] [facility] do we know the status of this?

## 2019-10-19 NOTE — Telephone Encounter (Signed)
We do have samples of Breo, however not the 200 MCG just 100 MCG. Just wanted to clarify before if gave her one. Please advise.

## 2019-10-22 NOTE — Telephone Encounter (Signed)
The 168mcg for now is fine.   Best, Dr Higinio Plan

## 2019-10-23 NOTE — Telephone Encounter (Signed)
1 Breo sample left up front for patient. Patient is aware.    Lot: [id number] Exp: 01/06/2020

## 2019-11-16 ENCOUNTER — Other Ambulatory Visit: Payer: Self-pay

## 2019-11-16 DIAGNOSIS — E559 Vitamin D deficiency, unspecified: Secondary | ICD-10-CM

## 2019-11-16 MED ORDER — CHOLECALCIFEROL 10 MCG (400 UNIT) PO TABS: 1200.0000 [IU] | ORAL_TABLET | Freq: Every day | ORAL | 2 refills | Status: DC

## 2019-11-16 MED ORDER — FERROUS SULFATE 325 (65 FE) MG PO TABS: ORAL_TABLET | ORAL | 3 refills | Status: DC

## 2019-12-17 ENCOUNTER — Other Ambulatory Visit: Payer: Self-pay | Admitting: Family Medicine

## 2019-12-17 DIAGNOSIS — J069 Acute upper respiratory infection, unspecified: Secondary | ICD-10-CM

## 2020-01-10 NOTE — Progress Notes (Signed)
Subjective:    Patient ID: Regina Leach, female    DOB: 1966-06-03, 54 y.o.   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  Experienced immediate pain, went to urgent care.  Had x-rays showing a mildly displaced fracture of the proximal phalanx of her fourth digit.  Still having swelling and pain of this toe.  Does not have a hard sole (has been using flip-flops) and has not been buddy taping. Was told that she needed to follow-up with her PCP to discuss going to her orthopedist.  She has been taking Tylenol that has not helped with the pain.  Says it is hurting so bad it is keeping her up at night.  Has a cane she is using to off put pressure.  Brought in records from [facility] urgent care that will be scanned into her chart.  Of note, says she is in a hurry today because her brother passed away yesterday and she needs to meet her family to make funeral arrangements.  Endorses she would have rescheduled this appointment but she was worried that her foot would not heal appropriately if she did not follow-up today.  She is handling this well for now.   Objective:  BP (!) 172/82   Pulse 93   Wt 235 lb 9.6 oz (106.9 kg)   SpO2 97%   BMI 40.44 kg/m  Vitals and nursing note reviewed  General: NAD, in mild discomfort Respiratory: Unlabored breathing Extremities: Notable swelling and mild darkening discoloration to fourth toe, otherwise toes same in size in comparison to right foot.  Tender to palpation around fourth toe and slightly proximal.  5/5 ROM at ankle joint bilaterally.  5/5 ankle strength with flexion/extension/pronation/supination.  Sensation to light touch intact throughout foot into toes bilaterally.  Palpable  dorsalis pedis and posterior tibialis pulses.  Skin: warm and dry  Psych: normal affect   Fourth digit with proximal phalanx fracture.   Assessment & Plan:   Nondisplaced fracture of proximal phalanx of unspecified lesser toe(s), initial encounter for closed fracture Occurred on 1/30.  Notable swelling with continued pain.  Recommended she start wearing a hard soled shoe for immobilization, informed she can get this at [facility].  Additionally discussed buddy taping (in a hurry during today's appointment, did not want it taped today) with tape and gauze between toes.  Ice, elevate frequently with Tylenol every 4-6 hour PRN.  Sent in a very small course of tramadol 50mg  (6 tablets) to help for severe pains as this continues to heal.  Recommend follow-up in 1-2 weeks or sooner if needed, will hold off on orthopedic referral at this time given the nature of fracture can be dealt with in our clinic.  Essential hypertension, benign Elevated, 172/82 in the clinic today. Patient attributes this to her toe pain.  Discussed the absolute importance of her following up in the next few weeks as discussed above to also address her blood pressure.  Continue HCTZ 25 mg for now, can consider adding Norvasc in the future if persistently elevated.  Grief reaction Brother passed away yesterday.  Reports she is handling this well, provided supportive listening.  Will continue to follow-up and address her grief process.   Work note  provided.  Follow-up in 1-2 weeks for above, sooner if needed.  [facility] Resident PGY-2

## 2020-01-11 ENCOUNTER — Other Ambulatory Visit: Payer: Self-pay

## 2020-01-11 ENCOUNTER — Ambulatory Visit (INDEPENDENT_AMBULATORY_CARE_PROVIDER_SITE_OTHER): Payer: Self-pay | Admitting: Family Medicine

## 2020-01-11 ENCOUNTER — Encounter: Payer: Self-pay | Admitting: Family Medicine

## 2020-01-11 VITALS — BP 172/82 | HR 93 | Wt 235.6 lb

## 2020-01-11 DIAGNOSIS — I10 Essential (primary) hypertension: Secondary | ICD-10-CM

## 2020-01-11 DIAGNOSIS — S92516A Nondisplaced fracture of proximal phalanx of unspecified lesser toe(s), initial encounter for closed fracture: Secondary | ICD-10-CM

## 2020-01-11 DIAGNOSIS — F4321 Adjustment disorder with depressed mood: Secondary | ICD-10-CM | POA: Insufficient documentation

## 2020-01-11 DIAGNOSIS — S92505P Nondisplaced unspecified fracture of left lesser toe(s), subsequent encounter for fracture with malunion: Secondary | ICD-10-CM

## 2020-01-11 MED ORDER — TRAMADOL HCL 50 MG PO TABS: 50.0000 mg | ORAL_TABLET | Freq: Two times a day (BID) | ORAL | 0 refills | Status: DC | PRN

## 2020-01-11 NOTE — Assessment & Plan Note (Signed)
Brother passed away yesterday.  Reports she is handling this well, provided supportive listening.  Will continue to follow-up and address her grief process.

## 2020-01-11 NOTE — Assessment & Plan Note (Signed)
Elevated, 172/82 in the clinic today. Patient attributes this to her toe pain.  Discussed the absolute importance of her following up in the next few weeks as discussed above to also address her blood pressure.  Continue HCTZ 25 mg for now, can consider adding Norvasc in the future if persistently elevated.

## 2020-01-11 NOTE — Patient Instructions (Addendum)
It was so wonderful to see you today.  For your toe--I recommend that you get a hard sole to put anterior shoe or get a postop shoe to help from bending your foot too much while this heals.  Additionally you can buddy tape your toe to the one next to it with surgical tape and put 1-2 pieces of gauze in between your toe to help from any skin breakdown.  To help with pain, you can ice this area several times a day and take Tylenol 650 mg every 6-8 hours.  I will send in a very small prescription of stronger medication to get get you through this acute period.   It would be helpful to get the results of your x-rays that we can better determine the next steps for this fracture.

## 2020-01-11 NOTE — Assessment & Plan Note (Addendum)
Occurred on 1/30.  Notable swelling with continued pain.  Recommended she start wearing a hard soled shoe for immobilization, informed [facility].  Additionally discussed buddy taping (in a hurry during today's appointment, did not want it taped today) with tape and gauze between toes.  Ice, elevate frequently with Tylenol every 4-6 hour PRN.  Sent in a very small course of tramadol 50mg  (6 tablets) to help for severe pains as this continues to heal.  Recommend follow-up in 1-2 weeks or sooner if needed, will hold off on orthopedic referral at this time given the nature of fracture can be dealt with in our clinic.

## 2020-01-16 IMAGING — MG DIGITAL SCREENING BILATERAL MAMMOGRAM WITH TOMO AND CAD
8 series · 9 of 24 positions shown · non-contrast
Comparison: Previous exam(s).

CLINICAL DATA: Screening.

EXAM:
DIGITAL SCREENING BILATERAL MAMMOGRAM WITH TOMO AND CAD

[L CC synth-2D]
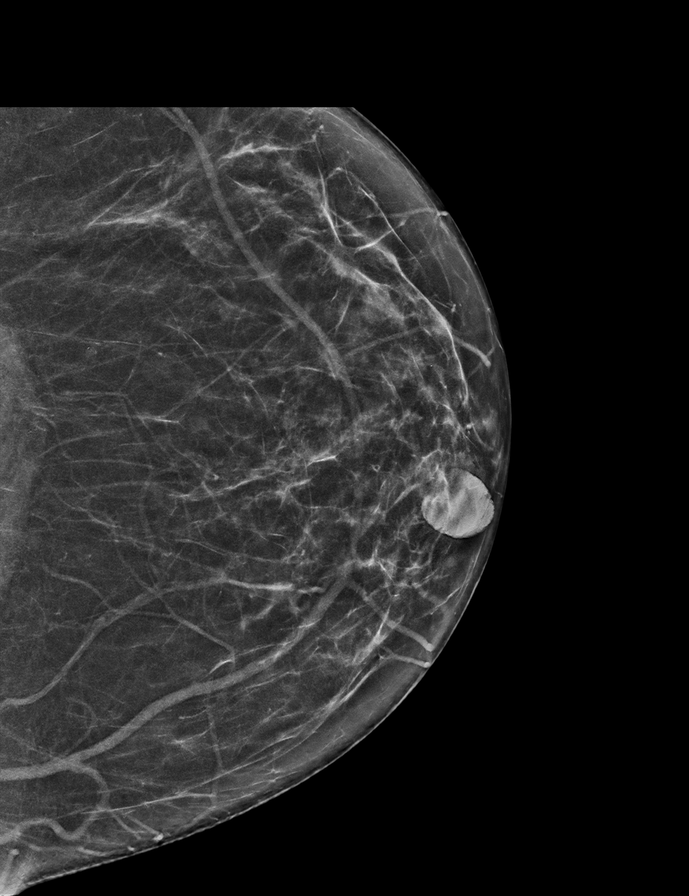

[R MLO synth-2D]
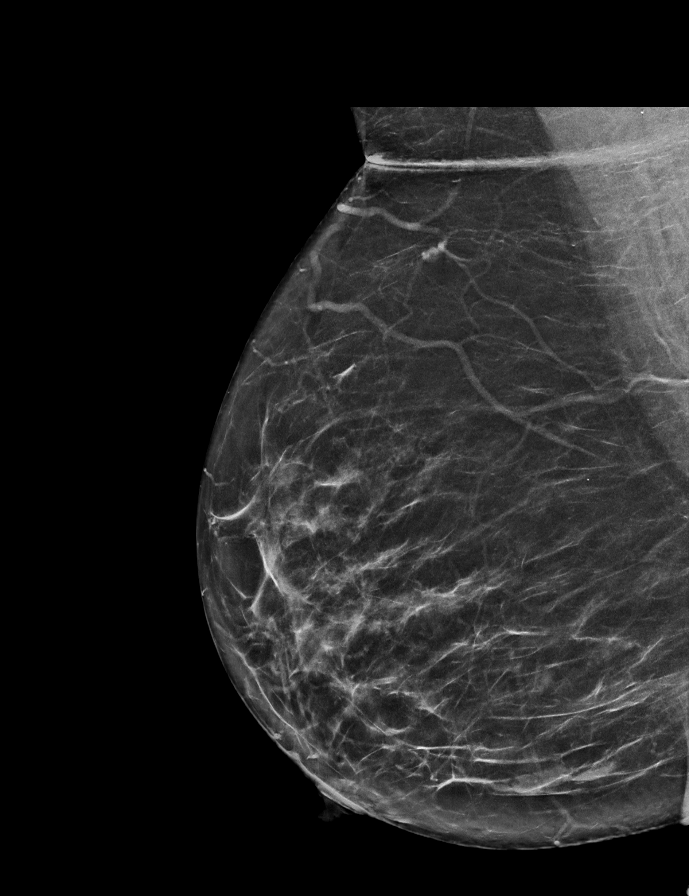

[R CC synth-2D]
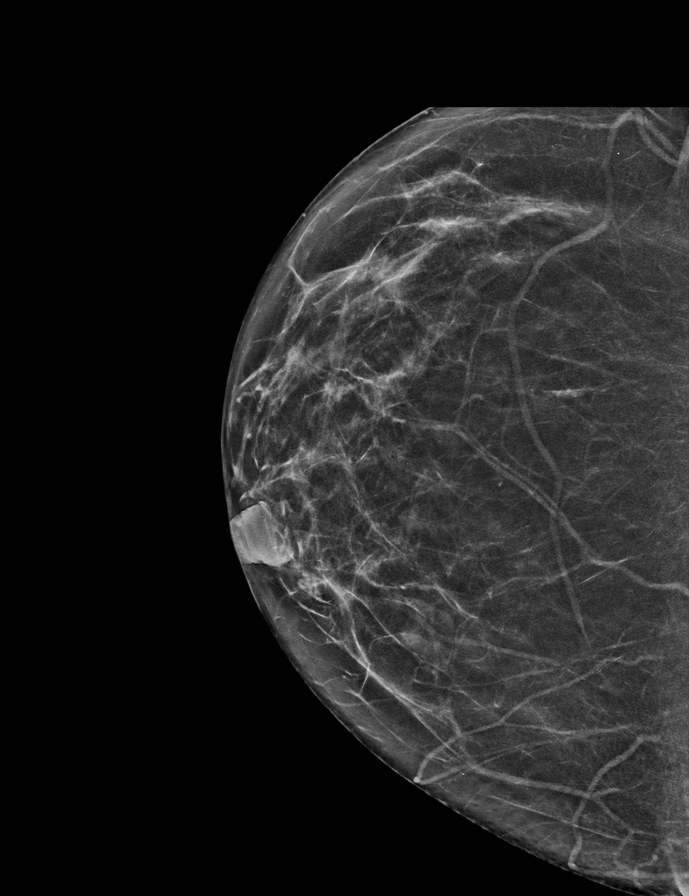

[L MLO synth-2D]
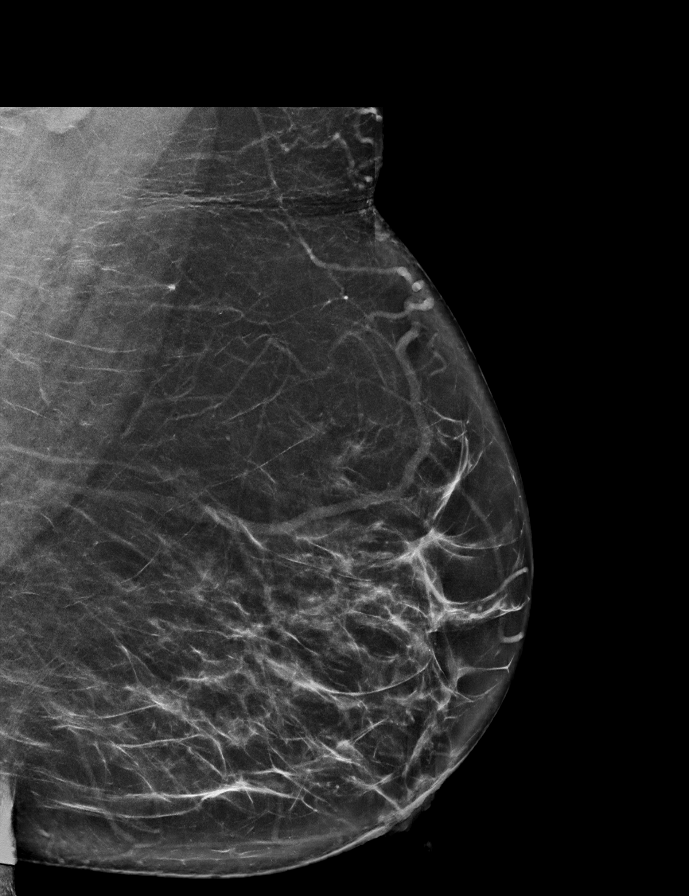

[L MLO tomo · 2 of 75 frames shown]
[frame 25/75]
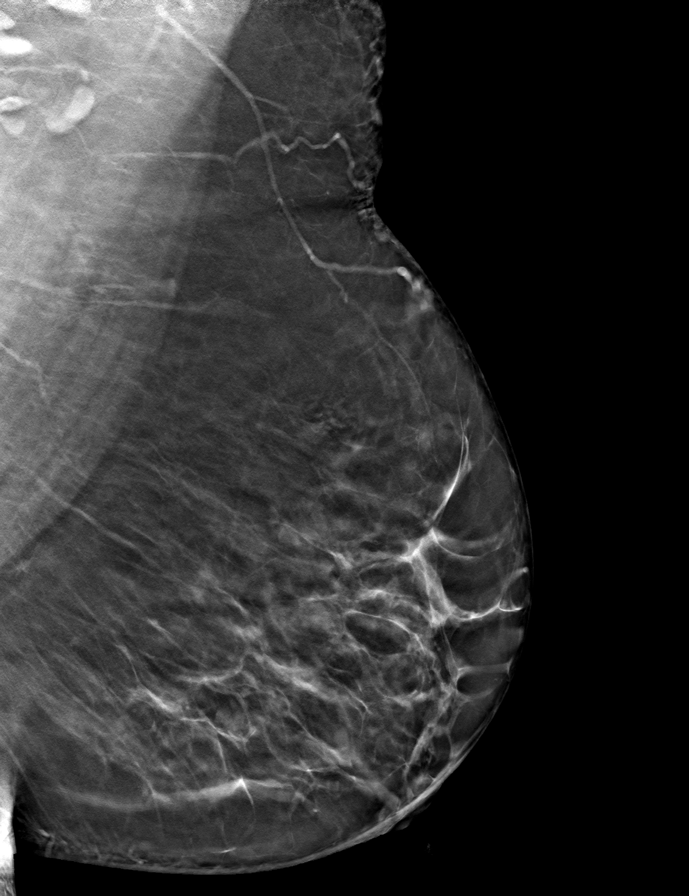
[frame 38/75]
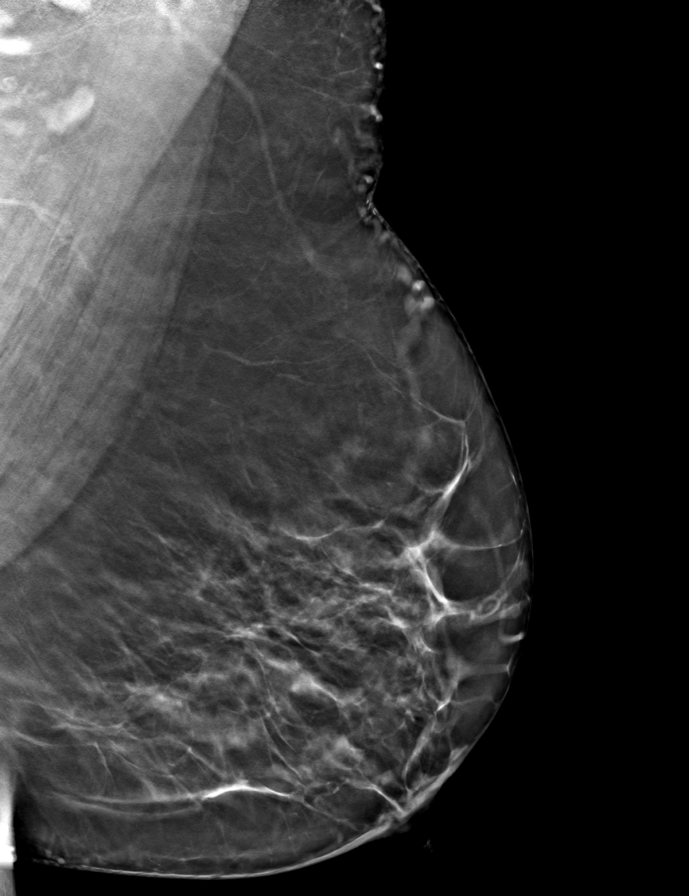

[R CC tomo · tomo slice 31/61.0]
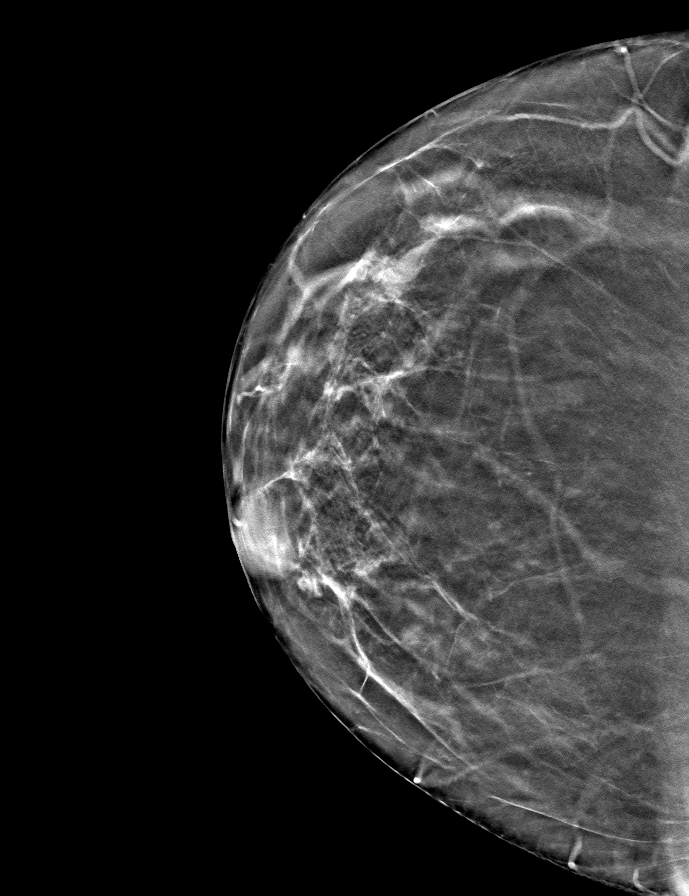

[R MLO tomo · tomo slice 36/71.0]
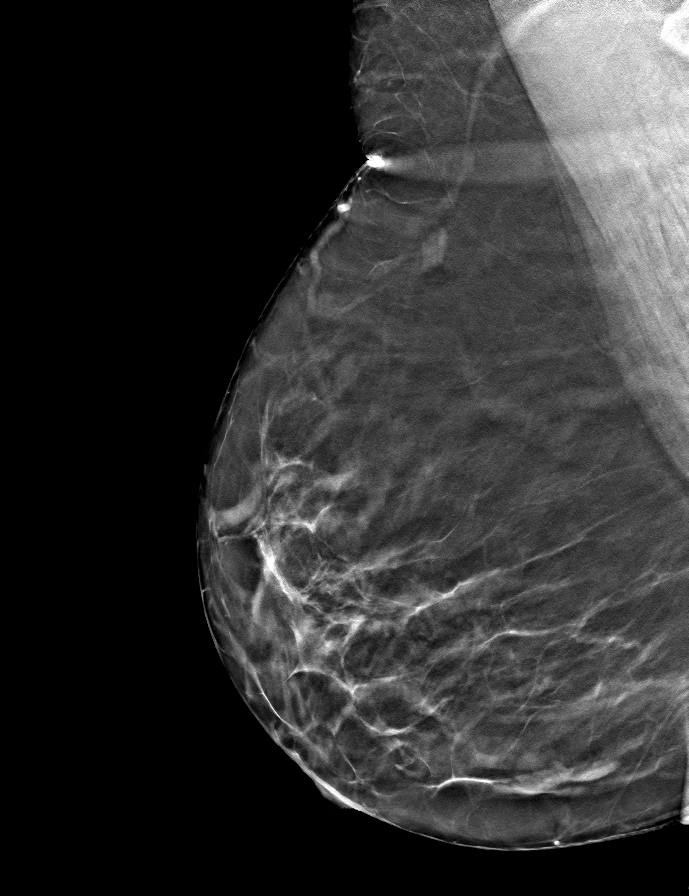

[L CC tomo · tomo slice 32/63.0]
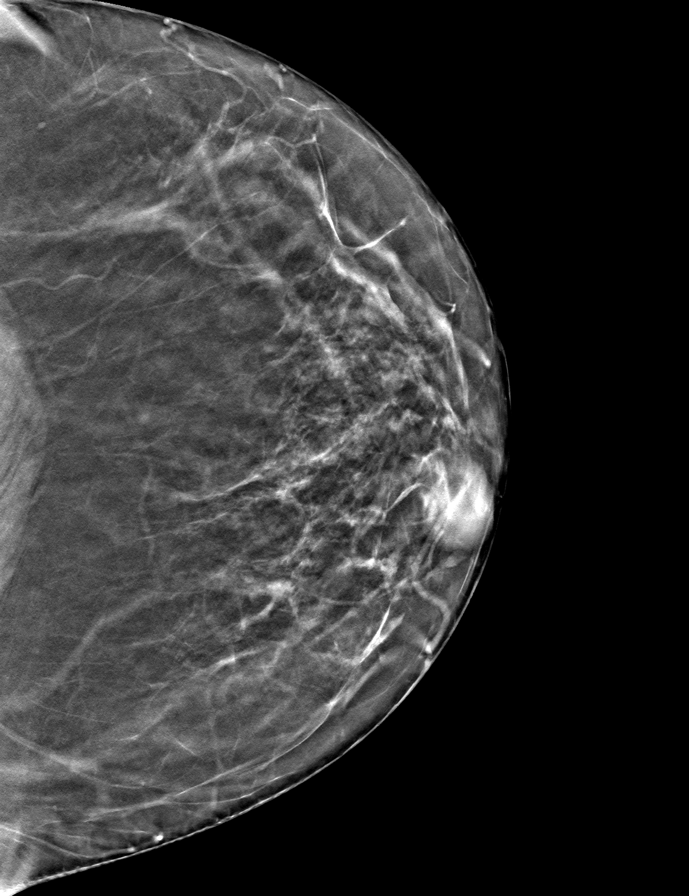

[9 of 24 positions shown; findings below may reference images not displayed]

ACR Breast Density Category b: There are scattered areas of
fibroglandular density.
FINDINGS: There are no findings suspicious for malignancy. Images were
processed with CAD.
IMPRESSION: No mammographic evidence of malignancy. A result letter of this
screening mammogram will be mailed directly to the patient.

RECOMMENDATION:
Screening mammogram in one year. (Code:[id number])

BI-RADS CATEGORY  1: Negative.

## 2020-02-06 ENCOUNTER — Ambulatory Visit: Payer: No Typology Code available for payment source | Admitting: Family Medicine

## 2020-02-07 ENCOUNTER — Other Ambulatory Visit: Payer: Self-pay

## 2020-02-07 ENCOUNTER — Ambulatory Visit (INDEPENDENT_AMBULATORY_CARE_PROVIDER_SITE_OTHER): Payer: Self-pay | Admitting: Family Medicine

## 2020-02-07 VITALS — BP 165/80 | HR 88 | Temp 98.8°F | Wt 230.0 lb

## 2020-02-07 DIAGNOSIS — S92516A Nondisplaced fracture of proximal phalanx of unspecified lesser toe(s), initial encounter for closed fracture: Secondary | ICD-10-CM

## 2020-02-07 DIAGNOSIS — Z1211 Encounter for screening for malignant neoplasm of colon: Secondary | ICD-10-CM

## 2020-02-07 MED ORDER — DICLOFENAC SODIUM 1 % EX GEL: 2.0000 g | Freq: Four times a day (QID) | CUTANEOUS | 0 refills | Status: DC

## 2020-02-08 DIAGNOSIS — Z1211 Encounter for screening for malignant neoplasm of colon: Secondary | ICD-10-CM | POA: Insufficient documentation

## 2020-02-08 NOTE — Assessment & Plan Note (Signed)
No symptoms, overdue for regularly scheduled screening, she consents to referral for colonoscopy as opposed to Cologuard

## 2020-02-08 NOTE — Assessment & Plan Note (Signed)
Continue Tylenol and can add Voltaren gel, we will not refill tramadol at this time we will leave that decision to Ortho.  She does have a referral to [facility] and is working on scheduling that, we did describe that there is an Ortho urgent care although the finances of that might be more significant.  We did describe as we cannot see the imaging we cannot guarantee that Ortho will feel that there is a particular surgical intervention required

## 2020-02-08 NOTE — Progress Notes (Signed)
    SUBJECTIVE:   CHIEF COMPLAINT / HPI:   Patient with toe fracture that she says was diagnosed outside of our system over 30 days ago.  She says this was a work-related issue when a patient rolled over her toe, she is a CMA.  She said that she has had consistent pain since then she has actually obtained a hard sole shoe and has been wearing it but has not had any improvement in her discomfort.  She comes in requesting a work note and extension of pain medication, she says that she actually has received a referral to Ortho and has not been able to schedule a follow-up appointment with them yet.  We do not have imaging available in our office at this time because it was out of the system, she said that she has a disc somewhere but it does not seem to be uploaded into our epic  We also discussed regular screening colonoscopy and patient consents, she does not have any symptoms and this is her normal screening  PERTINENT  PMH / PSH:   OBJECTIVE:   BP (!) 165/80   Pulse 88   Temp 98.8 F (37.1 C) (Oral)   Wt 230 lb (104.3 kg)   LMP  (LMP Unknown) Comment: "its been a minute"   SpO2 97%   BMI 39.48 kg/m   General: Alert and in no acute distress MSK: Sensation and cap refill present in all toes, visually consistent with her last visit with Dr. Annia Friendly, still significant pain with any manipulation of fourth toe of left foot.  Some skin discoloration, still consistent around proximal joint of fourth toe.  No indication of spreading infection or cellulitis, no open wounding.  Patient is able to ambulate with a limp on hard sole shoe  *Picture in media tab of toe  ASSESSMENT/PLAN:   Colon cancer screening No symptoms, overdue for regularly scheduled screening, she consents to referral for colonoscopy as opposed to Cologuard  Nondisplaced fracture of proximal phalanx of unspecified lesser toe(s), initial encounter for closed fracture Continue Tylenol and can add Voltaren gel, we will not  refill tramadol at this time we will leave that decision to Ortho.  She does have a referral to [facility] and is working on scheduling that, we did describe that there is an Ortho urgent care although the finances of that might be more significant.  We did describe as we cannot see the imaging we cannot guarantee that Ortho will feel that there is a particular surgical intervention required     Regina Rolling, DO [facility]

## 2020-02-11 ENCOUNTER — Other Ambulatory Visit: Payer: Self-pay

## 2020-02-11 DIAGNOSIS — J069 Acute upper respiratory infection, unspecified: Secondary | ICD-10-CM

## 2020-02-11 MED ORDER — BENZONATATE 100 MG PO CAPS: 200.0000 mg | ORAL_CAPSULE | Freq: Two times a day (BID) | ORAL | 1 refills | Status: DC | PRN

## 2020-02-12 ENCOUNTER — Other Ambulatory Visit: Payer: Self-pay | Admitting: *Deleted

## 2020-02-12 DIAGNOSIS — J069 Acute upper respiratory infection, unspecified: Secondary | ICD-10-CM

## 2020-02-20 ENCOUNTER — Telehealth (INDEPENDENT_AMBULATORY_CARE_PROVIDER_SITE_OTHER): Payer: Self-pay | Admitting: Family Medicine

## 2020-02-20 ENCOUNTER — Other Ambulatory Visit: Payer: Self-pay | Admitting: Family Medicine

## 2020-02-20 ENCOUNTER — Other Ambulatory Visit: Payer: Self-pay

## 2020-02-20 DIAGNOSIS — J069 Acute upper respiratory infection, unspecified: Secondary | ICD-10-CM

## 2020-02-20 DIAGNOSIS — J441 Chronic obstructive pulmonary disease with (acute) exacerbation: Secondary | ICD-10-CM

## 2020-02-20 DIAGNOSIS — J449 Chronic obstructive pulmonary disease, unspecified: Secondary | ICD-10-CM

## 2020-02-20 MED ORDER — DOXYCYCLINE HYCLATE 100 MG PO TABS: ORAL_TABLET | ORAL | 0 refills | Status: DC

## 2020-02-20 NOTE — Progress Notes (Signed)
 [facility] Telemedicine Visit  Patient consented to have virtual visit. Method of visit: Telephone  Encounter participants: Patient: Regina Leach - located at home Provider: Myrene Buddy - located at [facility]  Chief Complaint: Increased wheezing, cough, sputum, mild shortness of breath at night.  HPI: 54 year old female who presents for 3 to 4-day history of increased productive cough.  She states that initially started out as a dry cough but has progressed to having green sputum.  Patient also states that she has had increased wheezing which has been worse at night but is persistent.  States that she is having intermittent very mild shortness of breath while doing activities due to the wheezing at night.  She has been using some albuterol very routinely over the last 3 to 4 days which is providing some temporary relief but has not made her symptoms resolved.  She has been on Breo, but states that she ran out of this and it is too expensive for her to afford.  ROS: per HPI  Pertinent PMHx: COPD  Exam:  General: No acute distress, shortness of breath, or distress noted over the phone -Respiratory: Able speak in clear coherent sentences Psych: Very pleasant, appropriate, coherent thought process  Assessment/Plan:  Obstructive lung disease (HCC) Patient appears to have a mild COPD exacerbation.  Has had increased cough, increased sputum with a very mild increase in dyspnea.  Will give doxycycline twice daily for 7 days.  Discussed prednisone with patient but has a history of GI bleed with the prednisone and does not want start medication today.  Lastly I did give her 2 Breo Ellipta samples.  Recommended that she follow-up with her PCP sometime in the near future to further clarify her COPD regimen as she may benefit from a switch to another medication that she can afford.    Time spent during visit with patient: 14 minutes

## 2020-02-21 MED ORDER — BREO ELLIPTA 100-25 MCG/INH IN AEPB: 1.0000 | INHALATION_SPRAY | Freq: Every day | RESPIRATORY_TRACT | 0 refills | Status: DC

## 2020-02-21 MED ORDER — FLUTICASONE FUROATE-VILANTEROL 100-25 MCG/INH IN AEPB: 1.0000 | INHALATION_SPRAY | Freq: Every day | RESPIRATORY_TRACT | 0 refills | Status: DC

## 2020-02-22 NOTE — Assessment & Plan Note (Signed)
Patient appears to have a mild COPD exacerbation.  Has had increased cough, increased sputum with a very mild increase in dyspnea.  Will give doxycycline twice daily for 7 days.  Discussed prednisone with patient but has a history of GI bleed with the prednisone and does not want start medication today.  Lastly I did give her 2 Breo Ellipta samples.  Recommended that she follow-up with her PCP sometime in the near future to further clarify her COPD regimen as she may benefit from a switch to another medication that she can afford.

## 2020-03-11 ENCOUNTER — Other Ambulatory Visit: Payer: Self-pay | Admitting: *Deleted

## 2020-03-11 DIAGNOSIS — J069 Acute upper respiratory infection, unspecified: Secondary | ICD-10-CM

## 2020-03-11 MED ORDER — BENZONATATE 100 MG PO CAPS: 200.0000 mg | ORAL_CAPSULE | Freq: Two times a day (BID) | ORAL | 1 refills | Status: DC | PRN

## 2020-04-08 ENCOUNTER — Other Ambulatory Visit: Payer: Self-pay | Admitting: Family Medicine

## 2020-04-08 DIAGNOSIS — I10 Essential (primary) hypertension: Secondary | ICD-10-CM

## 2020-04-09 ENCOUNTER — Other Ambulatory Visit: Payer: Self-pay | Admitting: *Deleted

## 2020-04-09 DIAGNOSIS — I10 Essential (primary) hypertension: Secondary | ICD-10-CM

## 2020-04-11 MED ORDER — HYDROCHLOROTHIAZIDE 25 MG PO TABS: 25.0000 mg | ORAL_TABLET | Freq: Every day | ORAL | 0 refills | Status: DC

## 2020-05-07 ENCOUNTER — Other Ambulatory Visit: Payer: Self-pay

## 2020-05-07 DIAGNOSIS — Z1231 Encounter for screening mammogram for malignant neoplasm of breast: Secondary | ICD-10-CM

## 2020-05-29 ENCOUNTER — Other Ambulatory Visit: Payer: Self-pay

## 2020-05-29 ENCOUNTER — Ambulatory Visit
Admission: RE | Admit: 2020-05-29 | Discharge: 2020-05-29 | Disposition: A | Discharge status: Home / Self Care | Payer: No Typology Code available for payment source | Source: Ambulatory Visit | Attending: Obstetrics and Gynecology | Admitting: Obstetrics and Gynecology

## 2020-05-29 ENCOUNTER — Ambulatory Visit: Payer: Self-pay | Admitting: *Deleted

## 2020-05-29 DIAGNOSIS — Z1231 Encounter for screening mammogram for malignant neoplasm of breast: Secondary | ICD-10-CM

## 2020-05-29 DIAGNOSIS — Z1239 Encounter for other screening for malignant neoplasm of breast: Secondary | ICD-10-CM

## 2020-05-29 NOTE — Progress Notes (Signed)
Ms. Regina Leach is a 54 y.o. female who presents to [facility] clinic today with no complaints. She is here for a screening mammogram.    Pap Smear: Pap not smear completed today. Last Pap smear was 01/13/2018 at [facility] clinic and was normal. Per patient she has a history of an abnormal Pap smear 10 years ago with a follow up Pap the following year was completed and has had 3 normal Pap smears since. Last Pap smear result is available in Epic.   Physical exam: Breasts Breasts symmetrical. No skin abnormalities bilateral breasts. No nipple retraction bilateral breasts. No nipple discharge bilateral breasts. No lymphadenopathy. No lumps palpated bilateral breasts.       Pelvic/Bimanual Pap is not indicated today.    Smoking History: Patient is a current smoker. She was referred to the [place] quit line and gave resources to the free smoking cessation classes at [facility].    Patient Navigation: Patient education provided. Access to services provided for patient through BCCCP program.   Colorectal Cancer Screening: Per patient she has not had a colonoscopy. She has a PCP and verbalized that she will inquire about a colonoscopy or FIT testing. No complaints today.    Breast and Cervical Cancer Risk Assessment: Patient has family history of breast cancer in her maternal aunt. No known genetic mutations or history of radiation treatment to the chest before age 4. Patient does not have history of cervical dysplasia, immunocompromised, or DES exposure in-utero.  Risk Assessment    Risk Scores      05/29/2020 05/03/2019   Last edited by: Priscille Heidelberg, RN Lynnell Dike, LPN   5-year risk: 1 % 1 %   Lifetime risk: 6.3 % 6.5 %          A: BCCCP exam without pap smear No complaints today.  P: Referred patient to the [facility] for a screening mammogram. Appointment scheduled May 29, 2020 at 9:00am on the mobile unit.   Mila Homer,  RN, FNP student 05/29/2020 8:23 AM   Attestation of Supervision of Student:  I confirm that I have verified the information documented in the nurse practitioner student's note and that I have also personally reperformed the history, physical exam and all medical decision making activities.  I have verified that all services and findings are accurately documented in this student's note; and I agree with management and plan as outlined in the documentation. I have also made any necessary editorial changes.  No complaints of pain or tenderness on exam.  Brannock, Kathaleen Maser, RN [facility] [employer], [facility] 05/29/2020 8:39 AM

## 2020-05-29 NOTE — Patient Instructions (Addendum)
Informed Regina Leach about breast self awareness. Patient did not need a Pap smear today due to last Pap smear was on 01/13/2018. Let her know BCCCP will cover Pap smears and HPV typing every 5 years unless has a history of abnormal Pap smears. Referred patient to the [facility] for screening mammogram. Appointment scheduled for May 29, 2020 at 9:00am on the mobile unit. Patient aware of appointment and escorted to the mobile unit. Let patient know the Breast Center will follow up with her within the next couple weeks with results of her mammogram by letter or phone. Regina Leach verbalized understanding.  Regina Homer, RN, FNP student 8:30 AM

## 2020-06-12 ENCOUNTER — Other Ambulatory Visit: Payer: Self-pay

## 2020-06-12 DIAGNOSIS — J069 Acute upper respiratory infection, unspecified: Secondary | ICD-10-CM

## 2020-06-12 MED ORDER — BENZONATATE 100 MG PO CAPS: 200.0000 mg | ORAL_CAPSULE | Freq: Two times a day (BID) | ORAL | 1 refills | Status: DC | PRN

## 2020-07-16 ENCOUNTER — Ambulatory Visit: Payer: No Typology Code available for payment source

## 2020-09-01 ENCOUNTER — Other Ambulatory Visit: Payer: Self-pay

## 2020-09-01 NOTE — Telephone Encounter (Signed)
Patient calls nurse line regarding receiving Breo Inhaler samples. Patient is in the process of applying for orange card, however, is still waiting to hear back. Patient states that she can feel a COPD flare up starting.   Will route to [place] for further medication assistance for patient without insurance.   Veronda Prude, RN

## 2020-09-02 NOTE — Telephone Encounter (Signed)
Patient can have her inhalers filled at the [facility] for $10 and I can assist her in enrolling for patient assistance.  When she comes to the pharmacy to pick up the medications I can have her complete the paperwork at that time.

## 2020-09-09 ENCOUNTER — Other Ambulatory Visit: Payer: Self-pay | Admitting: Family Medicine

## 2020-09-09 DIAGNOSIS — J449 Chronic obstructive pulmonary disease, unspecified: Secondary | ICD-10-CM

## 2020-09-09 MED ORDER — BREO ELLIPTA 100-25 MCG/INH IN AEPB: 1.0000 | INHALATION_SPRAY | Freq: Every day | RESPIRATORY_TRACT | 0 refills | Status: DC

## 2020-09-09 NOTE — Telephone Encounter (Signed)
Thank you so much for assisting, I have sent a prescription of Breo to the [facility].  Allayne Stack, DO

## 2020-09-09 NOTE — Telephone Encounter (Signed)
Patient is present at [facility] after saying she was instructed by office to see me at [facility] to receive her Breo inhaler.  We never received an rx for the Breo inhaler so we are unable to dispense medication to patient at this time.  If appropriate, please send an inhaler to [facility] in order for patient to receive at $10 pricing.

## 2020-09-10 MED FILL — BREO ELLIPTA 100-25 MCG INH: 100-25 | 30 days supply | Qty: 60 | Fill #0

## 2020-09-11 ENCOUNTER — Other Ambulatory Visit: Payer: Self-pay

## 2020-09-11 DIAGNOSIS — I10 Essential (primary) hypertension: Secondary | ICD-10-CM

## 2020-09-11 MED ORDER — HYDROCHLOROTHIAZIDE 25 MG PO TABS: 25.0000 mg | ORAL_TABLET | Freq: Every day | ORAL | 0 refills | Status: DC

## 2020-09-18 NOTE — Telephone Encounter (Signed)
Patient calls nurse line reporting that she is still experiencing cough and congestion since receiving Breo Inhaler. Patient is requesting Anoro inhaler. Patient states that she has used this in the past with success. Patient also reports headache since taking Breo.   To PCP   Please advise  Veronda Prude, RN

## 2020-09-19 ENCOUNTER — Other Ambulatory Visit: Payer: Self-pay | Admitting: Family Medicine

## 2020-09-19 DIAGNOSIS — J449 Chronic obstructive pulmonary disease, unspecified: Secondary | ICD-10-CM

## 2020-09-19 MED ORDER — ANORO ELLIPTA 62.5-25 MCG/INH IN AEPB: 1.0000 | INHALATION_SPRAY | Freq: Every day | RESPIRATORY_TRACT | 1 refills | Status: DC

## 2020-09-19 NOTE — Telephone Encounter (Signed)
Please let patient know that I have sent in a prescription for Anoro to [facility]. It would $10. She should also schedule an appointment in our clinic and no longer use the [facility].   Allayne Stack, DO

## 2020-09-19 NOTE — Telephone Encounter (Signed)
We can do the Anoro at $10 at [facility].

## 2020-09-19 NOTE — Telephone Encounter (Signed)
Can we call to see if she is willing to get scheduled and in our CIDD clinic next week for further evaluation?  If she has any further difficulty breathing she would need to be seen over the weekend.   Tresa Endo, would we have any assistance for Anoro, equivalent LAMA/LABA, or LAMA through the community [facility] wellness for the future if needed?   Thank you, Dr Annia Friendly

## 2020-09-21 ENCOUNTER — Encounter (HOSPITAL_COMMUNITY): Payer: Self-pay

## 2020-09-21 ENCOUNTER — Other Ambulatory Visit: Payer: Self-pay

## 2020-09-21 ENCOUNTER — Emergency Department (HOSPITAL_COMMUNITY): Payer: Self-pay

## 2020-09-21 ENCOUNTER — Emergency Department (HOSPITAL_COMMUNITY)
Admission: EM | Admit: 2020-09-21 | Discharge: 2020-09-21 | Disposition: A | Discharge status: Home / Self Care | Payer: Self-pay | Attending: Emergency Medicine | Admitting: Emergency Medicine

## 2020-09-21 ENCOUNTER — Ambulatory Visit
Admission: EM | Admit: 2020-09-21 | Discharge: 2020-09-21 | Discharge status: Against Medical Advice | Payer: No Typology Code available for payment source

## 2020-09-21 DIAGNOSIS — Z79899 Other long term (current) drug therapy: Secondary | ICD-10-CM | POA: Insufficient documentation

## 2020-09-21 DIAGNOSIS — R0789 Other chest pain: Secondary | ICD-10-CM | POA: Insufficient documentation

## 2020-09-21 DIAGNOSIS — I10 Essential (primary) hypertension: Secondary | ICD-10-CM | POA: Insufficient documentation

## 2020-09-21 DIAGNOSIS — Z20822 Contact with and (suspected) exposure to covid-19: Secondary | ICD-10-CM | POA: Insufficient documentation

## 2020-09-21 DIAGNOSIS — R0602 Shortness of breath: Secondary | ICD-10-CM | POA: Insufficient documentation

## 2020-09-21 DIAGNOSIS — F17210 Nicotine dependence, cigarettes, uncomplicated: Secondary | ICD-10-CM | POA: Insufficient documentation

## 2020-09-21 DIAGNOSIS — R062 Wheezing: Secondary | ICD-10-CM | POA: Insufficient documentation

## 2020-09-21 DIAGNOSIS — J441 Chronic obstructive pulmonary disease with (acute) exacerbation: Secondary | ICD-10-CM | POA: Insufficient documentation

## 2020-09-21 LAB — CBC WITH DIFFERENTIAL/PLATELET
Abs Immature Granulocytes: 0.03 10*3/uL (ref 0.00–0.07)
Basophils Absolute: 0 10*3/uL (ref 0.0–0.1)
Basophils Relative: 1 %
Eosinophils Absolute: 0.1 10*3/uL (ref 0.0–0.5)
Eosinophils Relative: 1 %
HCT: 39.2 % (ref 36.0–46.0)
Hemoglobin: 12.8 g/dL (ref 12.0–15.0)
Immature Granulocytes: 0 %
Lymphocytes Relative: 41 %
Lymphs Abs: 3.1 10*3/uL (ref 0.7–4.0)
MCH: 28.5 pg (ref 26.0–34.0)
MCHC: 32.7 g/dL (ref 30.0–36.0)
MCV: 87.3 fL (ref 80.0–100.0)
Monocytes Absolute: 0.5 10*3/uL (ref 0.1–1.0)
Monocytes Relative: 7 %
Neutro Abs: 3.8 10*3/uL (ref 1.7–7.7)
Neutrophils Relative %: 50 %
Platelets: 349 10*3/uL (ref 150–400)
RBC: 4.49 MIL/uL (ref 3.87–5.11)
RDW: 14.1 % (ref 11.5–15.5)
WBC: 7.5 10*3/uL (ref 4.0–10.5)
nRBC: 0 % (ref 0.0–0.2)

## 2020-09-21 LAB — COMPREHENSIVE METABOLIC PANEL
ALT: 22 U/L (ref 0–44)
AST: 19 U/L (ref 15–41)
Albumin: 3.9 g/dL (ref 3.5–5.0)
Alkaline Phosphatase: 53 U/L (ref 38–126)
Anion gap: 12 (ref 5–15)
BUN: 12 mg/dL (ref 6–20)
CO2: 27 mmol/L (ref 22–32)
Calcium: 9 mg/dL (ref 8.9–10.3)
Chloride: 100 mmol/L (ref 98–111)
Creatinine, Ser: 0.68 mg/dL (ref 0.44–1.00)
GFR, Estimated: >60 mL/min (ref >60)
Glucose, Bld: 94 mg/dL (ref 70–99)
Potassium: 3.7 mmol/L (ref 3.5–5.1)
Sodium: 139 mmol/L (ref 135–145)
Total Bilirubin: 0.4 mg/dL (ref 0.3–1.2)
Total Protein: 7 g/dL (ref 6.5–8.1)

## 2020-09-21 LAB — RESP PANEL BY RT PCR (RSV, FLU A&B, COVID)
Influenza A by PCR: NEGATIVE
Influenza B by PCR: NEGATIVE
Respiratory Syncytial Virus by PCR: NEGATIVE
SARS Coronavirus 2 by RT PCR: NEGATIVE

## 2020-09-21 LAB — BRAIN NATRIURETIC PEPTIDE: B Natriuretic Peptide: 202 pg/mL — ABNORMAL HIGH (ref 0.0–100.0)

## 2020-09-21 MED ORDER — DEXAMETHASONE SODIUM PHOSPHATE 10 MG/ML IJ SOLN
10.0000 mg | Freq: Once | INTRAMUSCULAR | Status: AC
  Administered 2020-09-21: 10 mg via INTRAMUSCULAR
  Filled 2020-09-21: qty 1

## 2020-09-21 MED ORDER — ALBUTEROL SULFATE HFA 108 (90 BASE) MCG/ACT IN AERS
4.0000 | INHALATION_SPRAY | Freq: Four times a day (QID) | RESPIRATORY_TRACT | Status: DC
  Administered 2020-09-21: 4 via RESPIRATORY_TRACT
  Filled 2020-09-21: qty 6.7

## 2020-09-21 NOTE — ED Notes (Signed)
Pt ambulatory to restroom without assistance but not able to produce UA.
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
## 2020-09-21 NOTE — ED Triage Notes (Signed)
Pt arrived via walk in, hx of COPD, states Sob at home x1 week, ran out of inhalers and neb tx at home. Coughing up pink phlem

## 2020-09-22 MED FILL — ANORO ELLIPTA 62.5-25 MCG I: 62.5-25 | 30 days supply | Qty: 60 | Fill #0

## 2020-09-22 NOTE — Telephone Encounter (Signed)
Attempted to call pt but son had phone, so I told him to have her call us back when she gets the chance. Jquan Egelston Bruna Potter, CMA

## 2020-09-23 ENCOUNTER — Telehealth: Payer: Self-pay

## 2020-09-23 NOTE — Telephone Encounter (Signed)
Faxed [employer] Patient Assistance application for Anoro to [employer] today for processing.

## 2020-09-30 NOTE — Telephone Encounter (Signed)
Regina Leach has been approved with [employer] Patient Assistance Program thru 09/23/21 and a 3 month supply of medication is being shipped to [facility] with an estimated delivery date of 10/02/20.

## 2020-10-07 ENCOUNTER — Telehealth: Payer: Self-pay

## 2020-10-07 NOTE — Telephone Encounter (Signed)
3 Anoro Inhalers received at [facility].  Left a message today for patient to return my call to schedule a pick up.

## 2020-10-08 NOTE — Telephone Encounter (Signed)
Patient picked up medication from [facility] on 10/08/20.

## 2020-11-06 ENCOUNTER — Ambulatory Visit: Payer: Medicaid Other

## 2020-11-17 ENCOUNTER — Other Ambulatory Visit: Payer: Self-pay

## 2020-11-17 DIAGNOSIS — I10 Essential (primary) hypertension: Secondary | ICD-10-CM

## 2020-11-17 MED ORDER — HYDROCHLOROTHIAZIDE 25 MG PO TABS: 25.0000 mg | ORAL_TABLET | Freq: Every day | ORAL | 0 refills | Status: DC

## 2020-11-17 NOTE — Telephone Encounter (Signed)
Patient calls nurse line requesting refill on HCTZ. Patient is also requesting 3 month supply on medication. Informed patient that she needs to schedule an appointment to follow up on her BP. Scheduled patient for 12/10/2020. Please advise if one month supply can be sent in to hold patient until scheduled appointment.   Veronda Prude, RN

## 2020-12-08 ENCOUNTER — Other Ambulatory Visit: Payer: Self-pay | Admitting: Family Medicine

## 2020-12-10 ENCOUNTER — Ambulatory Visit: Payer: Medicaid Other | Admitting: Family Medicine

## 2020-12-25 ENCOUNTER — Telehealth: Payer: Self-pay | Admitting: *Deleted

## 2020-12-25 NOTE — Telephone Encounter (Signed)
Spoke with pt and she said she would pick up inhalers today, informed that we close at 5pm. She hasnt came yet, so they will be placed in the med room. Aleric Froelich Bruna Potter, CMA

## 2021-01-06 ENCOUNTER — Other Ambulatory Visit: Payer: Self-pay

## 2021-01-06 ENCOUNTER — Other Ambulatory Visit: Payer: Self-pay | Admitting: *Deleted

## 2021-01-06 DIAGNOSIS — I10 Essential (primary) hypertension: Secondary | ICD-10-CM

## 2021-01-07 MED ORDER — HYDROCHLOROTHIAZIDE 25 MG PO TABS: 25.0000 mg | ORAL_TABLET | Freq: Every day | ORAL | 0 refills | Status: DC

## 2021-01-13 ENCOUNTER — Ambulatory Visit (INDEPENDENT_AMBULATORY_CARE_PROVIDER_SITE_OTHER): Payer: Self-pay

## 2021-01-13 ENCOUNTER — Other Ambulatory Visit: Payer: Self-pay

## 2021-01-13 DIAGNOSIS — Z111 Encounter for screening for respiratory tuberculosis: Secondary | ICD-10-CM

## 2021-01-13 DIAGNOSIS — Z23 Encounter for immunization: Secondary | ICD-10-CM

## 2021-01-13 NOTE — Progress Notes (Signed)
Patient is here for a PPD placement.  PPD placed in left forearm @ 10 am.  Patient will return 01/15/2021 to have PPD read.  Patient also received yearly flu vaccination today. Administered in LD, site unremarkable, tolerated injection well.     Veronda Prude, RN

## 2021-01-15 ENCOUNTER — Ambulatory Visit: Payer: Medicaid Other

## 2021-01-16 ENCOUNTER — Ambulatory Visit: Payer: Medicaid Other

## 2021-01-16 ENCOUNTER — Other Ambulatory Visit: Payer: Self-pay

## 2021-01-16 DIAGNOSIS — Z111 Encounter for screening for respiratory tuberculosis: Secondary | ICD-10-CM

## 2021-01-16 LAB — TB SKIN TEST
Induration: 0 mm
TB Skin Test: NEGATIVE

## 2021-01-16 NOTE — Progress Notes (Signed)
PPD Reading Note  PPD read and results entered in EpicCare.  Result: 0 mm induration.  Interpretation: Negative  Allergic reaction: no

## 2021-02-13 ENCOUNTER — Other Ambulatory Visit: Payer: Self-pay | Admitting: *Deleted

## 2021-02-13 MED ORDER — FERROUS SULFATE 325 (65 FE) MG PO TABS
ORAL_TABLET | ORAL | 3 refills | Status: DC
Start: 2021-02-13 — End: 2022-01-28

## 2021-03-17 ENCOUNTER — Telehealth: Payer: Self-pay

## 2021-03-17 NOTE — Telephone Encounter (Signed)
Spoke to patient and let her know we rec'd 3 Anoro inhalers from [employer], to the office.  Pt said she can come and pickup inhalers today.

## 2021-03-18 ENCOUNTER — Telehealth: Payer: Self-pay

## 2021-03-18 ENCOUNTER — Telehealth (INDEPENDENT_AMBULATORY_CARE_PROVIDER_SITE_OTHER): Payer: Medicaid Other | Admitting: Family Medicine

## 2021-03-18 ENCOUNTER — Encounter: Payer: Self-pay | Admitting: Family Medicine

## 2021-03-18 DIAGNOSIS — J441 Chronic obstructive pulmonary disease with (acute) exacerbation: Secondary | ICD-10-CM

## 2021-03-18 MED ORDER — DOXYCYCLINE HYCLATE 100 MG PO TABS: 100.0000 mg | ORAL_TABLET | Freq: Two times a day (BID) | ORAL | 0 refills | Status: AC

## 2021-03-18 MED ORDER — PREDNISONE 20 MG PO TABS: 40.0000 mg | ORAL_TABLET | Freq: Every day | ORAL | 0 refills | Status: DC

## 2021-03-18 NOTE — Telephone Encounter (Signed)
Patient calls nurse line reporting an upper respiratory infection. Patient has been using Anoro inhalers with minimal relief. Patient is speaking in full sentences, but does sound hoarse. Patient requesting an antibiotic. We have no more CIDD apts this week. Virtual scheduled, please call her as she does not have mychart.

## 2021-03-18 NOTE — Assessment & Plan Note (Signed)
Symptoms consistent with acute exacerbation given increase in  SOB,  sputum production and worsening cough.  Unknown triggers considered viral/bacterial but no reported fevers. Could be allergy component as trigger Reports Covid  Negative.   -Prednisone 40 mg dailyx 5/7 discussed with patient about previous GI bleed, she reports that she was not taking medication with meals.  We decided to restart and she will take with meals.  If she develops any signs of bleeding she is to stop medication immediately and call clinic or go to urgent care.Patient voices understanding. -Doxycycline 100 mg BID x7/7 -Continue current Anora inhaler -Unable to prescribe Flovent inhaler due to financial constraints -chest xray -Strict return precautions provided -Follow up with PCP if symptoms no better with treatment.

## 2021-03-18 NOTE — Telephone Encounter (Signed)
Samples provided to patient yesterday.

## 2021-03-18 NOTE — Progress Notes (Signed)
[facility] Telemedicine Visit  Patient consented to have virtual visit and was identified by name and date of birth. Method of visit: Telephone  Encounter participants: Patient: Regina Leach - located at home Provider: Dana Allan - located at home office Others (if applicable): none  Chief Complaint: cough and congestion  HPI:  Patient reports that her COPD is flaring up.She has been having worsening cough and sputum production for about 2 weeks. Yesterday went to clinic for [employer] as she had run out.  Since then she reports her shortness of breath has slightly improved.  Endorses worsening cough with thick white mucus production.  Denies any chest pain, pleuritic pain, fevers, abdominal pain, or decrease in urine production. Endorses intermittent bilateral lower extremity but none today when she takes her BP meds.  No recent weight gain and reports no history of CHF.  Endorses similar symptoms in the past with COPD flare ups and treated with steroids.   ROS: per HPI  Pertinent PMHx:  COPD HTN   Exam:  LMP  (LMP Unknown) Comment: "its been a minute"   Respiratory: sounds hoarse, and has wet cough, no wheezing heard and able to speak in short sentences  Assessment/Plan:  COPD with acute exacerbation (HCC) Symptoms consistent with acute exacerbation given increase in  SOB,  sputum production and worsening cough.  Unknown triggers considered viral/bacterial but no reported fevers. Could be allergy component as trigger Reports Covid  Negative.   -Prednisone 40 mg dailyx 5/7 discussed with patient about previous GI bleed, she reports that she was not taking medication with meals.  We decided to restart and she will take with meals.  If she develops any signs of bleeding she is to stop medication immediately and call clinic or go to urgent care.Patient voices understanding. -Doxycycline 100 mg BID x7/7 -Continue current Anora inhaler -Unable to  prescribe Flovent inhaler due to financial constraints -chest xray -Strict return precautions provided -Follow up with PCP if symptoms no better with treatment.    Time spent during visit with patient: 12 minutes

## 2021-04-03 ENCOUNTER — Other Ambulatory Visit: Payer: Self-pay

## 2021-04-03 ENCOUNTER — Encounter (HOSPITAL_COMMUNITY): Payer: Self-pay

## 2021-04-03 ENCOUNTER — Ambulatory Visit (HOSPITAL_COMMUNITY)
Admission: EM | Admit: 2021-04-03 | Discharge: 2021-04-03 | Disposition: A | Discharge status: Home / Self Care | Payer: Self-pay | Attending: Emergency Medicine | Admitting: Emergency Medicine

## 2021-04-03 DIAGNOSIS — S161XXA Strain of muscle, fascia and tendon at neck level, initial encounter: Secondary | ICD-10-CM

## 2021-04-03 DIAGNOSIS — S39012A Strain of muscle, fascia and tendon of lower back, initial encounter: Secondary | ICD-10-CM

## 2021-04-03 MED ORDER — TIZANIDINE HCL 2 MG PO TABS
2.0000 mg | ORAL_TABLET | Freq: Every day | ORAL | 0 refills | Status: DC
Start: 2021-04-03 — End: 2022-07-16

## 2021-04-03 MED ORDER — MELOXICAM 7.5 MG PO TABS: 7.5000 mg | ORAL_TABLET | Freq: Every day | ORAL | 0 refills | Status: DC
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
## 2021-04-03 NOTE — Discharge Instructions (Signed)
Light and regular activity as tolerated.  See exercises provided.  Heat application while active can help with muscle spasms.  Sleep with pillow under your knees.   Meloxicam daily. Don't take additional ibuprofen for aleve. Take with food. You can still add tylenol as needed.  Tizanidine at night as needed as a muscle relaxer, can cause drowsiness.  Follow up with your primary care provider as needed if symptoms persist as you may need further management.
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
## 2021-04-06 ENCOUNTER — Other Ambulatory Visit: Payer: Self-pay

## 2021-04-06 DIAGNOSIS — I10 Essential (primary) hypertension: Secondary | ICD-10-CM

## 2021-04-08 MED ORDER — HYDROCHLOROTHIAZIDE 25 MG PO TABS: 25.0000 mg | ORAL_TABLET | Freq: Every day | ORAL | 0 refills | Status: DC

## 2021-05-25 ENCOUNTER — Other Ambulatory Visit: Payer: Self-pay | Admitting: Obstetrics and Gynecology

## 2021-05-25 DIAGNOSIS — Z1231 Encounter for screening mammogram for malignant neoplasm of breast: Secondary | ICD-10-CM

## 2021-05-26 ENCOUNTER — Other Ambulatory Visit: Payer: Self-pay | Admitting: Family Medicine

## 2021-05-26 DIAGNOSIS — I10 Essential (primary) hypertension: Secondary | ICD-10-CM

## 2021-06-11 ENCOUNTER — Ambulatory Visit: Payer: Medicaid Other

## 2021-06-25 ENCOUNTER — Other Ambulatory Visit: Payer: Self-pay | Admitting: Family Medicine

## 2021-06-25 DIAGNOSIS — I10 Essential (primary) hypertension: Secondary | ICD-10-CM

## 2021-06-25 NOTE — Telephone Encounter (Signed)
Called patient to discuss refill request. Asked patient to come in for a visit as she has not been seen in-person since March, 2021. Patient says she will call to make an appointment.

## 2021-07-21 ENCOUNTER — Telehealth: Payer: Self-pay

## 2021-07-21 NOTE — Telephone Encounter (Signed)
LEFT VM REGARDING PAP MEDICATION (ANORO) BEING READY FOR PICKUP.  INHALERS READY & LABELED IN MED ROOM.

## 2021-07-28 NOTE — Telephone Encounter (Signed)
Medication given to patient

## 2021-08-31 ENCOUNTER — Other Ambulatory Visit: Payer: Self-pay

## 2021-08-31 DIAGNOSIS — I10 Essential (primary) hypertension: Secondary | ICD-10-CM

## 2021-08-31 MED ORDER — HYDROCHLOROTHIAZIDE 25 MG PO TABS: ORAL_TABLET | ORAL | 0 refills | Status: DC

## 2021-08-31 NOTE — Telephone Encounter (Signed)
Patient reports she was exposed to covid and can not make an apt at this time. However, she is out of her BP meds. Patient advised to make a future apt.

## 2021-10-19 ENCOUNTER — Other Ambulatory Visit: Payer: Self-pay

## 2021-10-19 DIAGNOSIS — J069 Acute upper respiratory infection, unspecified: Secondary | ICD-10-CM

## 2021-10-19 MED ORDER — BENZONATATE 100 MG PO CAPS: 200.0000 mg | ORAL_CAPSULE | Freq: Two times a day (BID) | ORAL | 1 refills | Status: DC | PRN

## 2021-10-19 NOTE — Telephone Encounter (Signed)
Refill sent for Tessalon.

## 2021-10-21 ENCOUNTER — Other Ambulatory Visit: Payer: Self-pay | Admitting: Student

## 2021-10-21 DIAGNOSIS — I10 Essential (primary) hypertension: Secondary | ICD-10-CM

## 2021-11-09 ENCOUNTER — Encounter: Payer: Medicaid Other | Admitting: Student

## 2021-12-04 ENCOUNTER — Encounter: Payer: Medicaid Other | Admitting: Student

## 2021-12-08 ENCOUNTER — Other Ambulatory Visit: Payer: Self-pay | Admitting: Student

## 2021-12-08 DIAGNOSIS — J069 Acute upper respiratory infection, unspecified: Secondary | ICD-10-CM

## 2021-12-09 ENCOUNTER — Encounter: Payer: Medicaid Other | Admitting: Student
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
## 2021-12-11 ENCOUNTER — Ambulatory Visit: Payer: Medicaid Other

## 2021-12-17 ENCOUNTER — Other Ambulatory Visit: Payer: Self-pay | Admitting: Student

## 2021-12-17 DIAGNOSIS — I10 Essential (primary) hypertension: Secondary | ICD-10-CM

## 2021-12-23 ENCOUNTER — Encounter: Payer: Medicaid Other | Admitting: Student

## 2022-01-13 ENCOUNTER — Other Ambulatory Visit: Payer: Self-pay | Admitting: Student

## 2022-01-13 DIAGNOSIS — I10 Essential (primary) hypertension: Secondary | ICD-10-CM

## 2022-01-26 ENCOUNTER — Ambulatory Visit (INDEPENDENT_AMBULATORY_CARE_PROVIDER_SITE_OTHER): Payer: Self-pay | Admitting: *Deleted

## 2022-01-26 ENCOUNTER — Other Ambulatory Visit: Payer: Self-pay

## 2022-01-26 DIAGNOSIS — Z111 Encounter for screening for respiratory tuberculosis: Secondary | ICD-10-CM

## 2022-01-26 NOTE — Progress Notes (Signed)
PPD Placement note Regina Leach, 56 y.o. female is here today for placement of PPD test Reason for PPD test: new job  Pt taken PPD test before: yes Verified in allergy area and with patient that they are not allergic to the products PPD is made of (Phenol or Tween). Yes Is patient taking any oral or IV steroid medication now or have they taken it in the last month? no Has the patient ever received the BCG vaccine?: no Has the patient been in recent contact with anyone known or suspected of having active TB disease?: no  P:  PPD placed on 01/26/2022.  Patient advised to return for reading within 48-72 hours.    Patient is scheduled to see Dr. Melissa Noon on Thursday at 10:05am and will have her PPD read that day. She will need a note for work.  Chiann Goffredo,CMA

## 2022-01-28 ENCOUNTER — Ambulatory Visit (INDEPENDENT_AMBULATORY_CARE_PROVIDER_SITE_OTHER): Payer: Self-pay | Admitting: Student

## 2022-01-28 ENCOUNTER — Other Ambulatory Visit: Payer: Self-pay

## 2022-01-28 ENCOUNTER — Encounter: Payer: Self-pay | Admitting: Student

## 2022-01-28 VITALS — BP 148/82 | HR 86 | Ht 64.0 in | Wt 240.0 lb

## 2022-01-28 DIAGNOSIS — Z0000 Encounter for general adult medical examination without abnormal findings: Secondary | ICD-10-CM

## 2022-01-28 DIAGNOSIS — I10 Essential (primary) hypertension: Secondary | ICD-10-CM

## 2022-01-28 DIAGNOSIS — Z720 Tobacco use: Secondary | ICD-10-CM

## 2022-01-28 DIAGNOSIS — J449 Chronic obstructive pulmonary disease, unspecified: Secondary | ICD-10-CM

## 2022-01-28 DIAGNOSIS — E559 Vitamin D deficiency, unspecified: Secondary | ICD-10-CM

## 2022-01-28 DIAGNOSIS — J441 Chronic obstructive pulmonary disease with (acute) exacerbation: Secondary | ICD-10-CM

## 2022-01-28 LAB — TB SKIN TEST
Induration: 0 mm
TB Skin Test: NEGATIVE

## 2022-01-28 MED ORDER — TRELEGY ELLIPTA 100-62.5-25 MCG/ACT IN AEPB: 1.0000 | INHALATION_SPRAY | Freq: Every day | RESPIRATORY_TRACT | 1 refills | Status: DC

## 2022-01-28 MED ORDER — NICOTINE 21 MG/24HR TD PT24
21.0000 mg | MEDICATED_PATCH | Freq: Every day | TRANSDERMAL | 0 refills | Status: DC
  Filled 2022-01-28 – 2022-02-05 (×2): qty 28, 28d supply, fill #0

## 2022-01-28 MED ORDER — FERROUS SULFATE 325 (65 FE) MG PO TABS: ORAL_TABLET | ORAL | 3 refills | Status: DC

## 2022-01-28 MED ORDER — PREDNISONE 20 MG PO TABS
40.0000 mg | ORAL_TABLET | Freq: Every day | ORAL | 0 refills | Status: AC
  Filled 2022-01-28 – 2022-02-05 (×2): qty 10, 5d supply, fill #0

## 2022-01-28 MED ORDER — DOXYCYCLINE HYCLATE 100 MG PO TABS
100.0000 mg | ORAL_TABLET | Freq: Two times a day (BID) | ORAL | 0 refills | Status: DC
  Filled 2022-01-28 – 2022-02-05 (×2): qty 20, 10d supply, fill #0

## 2022-01-28 MED ORDER — CHOLECALCIFEROL 10 MCG (400 UNIT) PO TABS: 1200.0000 [IU] | ORAL_TABLET | Freq: Every day | ORAL | 2 refills | Status: DC

## 2022-01-28 MED ORDER — CHOLECALCIFEROL 10 MCG (400 UNIT) PO TABS
1200.0000 [IU] | ORAL_TABLET | Freq: Every day | ORAL | 2 refills | Status: DC
  Filled 2022-01-28: qty 90, 30d supply, fill #0

## 2022-01-28 MED ORDER — FERROUS SULFATE 325 (65 FE) MG PO TABS
ORAL_TABLET | ORAL | 3 refills | Status: DC
  Filled 2022-01-28: qty 30, 30d supply, fill #0

## 2022-01-28 MED ORDER — NICOTINE 21 MG/24HR TD PT24: 21.0000 mg | MEDICATED_PATCH | Freq: Every day | TRANSDERMAL | 0 refills | Status: DC

## 2022-01-28 MED ORDER — TRELEGY ELLIPTA 100-62.5-25 MCG/ACT IN AEPB
1.0000 | INHALATION_SPRAY | Freq: Every day | RESPIRATORY_TRACT | 1 refills | Status: DC
  Filled 2022-01-28: qty 60, 30d supply, fill #0

## 2022-01-28 NOTE — Assessment & Plan Note (Signed)
Appears to have mild COPD exacerbation- symptoms consistent with increased cough, sputum production and mild dyspnea. Unknown trigger, could be viral/bacterial infection but denies any fever or chills. - Doxycycline BID for 7 days - Prednisone 40mg  daily x 5 days. Take with meals. Told to stop medication immediately and go to urgent care or call clinic if she develops signs of bleeding (has hx GI bleed). - Prescribed Trelegy ellipta. Has cost barriers, but sent to [facility] as this should cost either 4$ or 10$ - Will f/u in 2 weeks. If no symptom resolution, will get X-ray at that time. Will want to ensure she was able to get her medications/is taking them properly. - Strict return precautions provided

## 2022-01-28 NOTE — Patient Instructions (Addendum)
It was great seeing you today.  Our plan for today: - Your phlegm production and cough are likely due to your COPD. I sent in for Prednisone (steroid) and Doxycycline (antibiotic). Takes these as prescribed. - I refilled your other medications. - I placed an order for a colonoscopy and a screening mammogram. Please call the GI breast center to schedule your mammogram. - We are getting labs today to check your lipids and A1c. I will call you if these are abnormal.  We will plan to see you again in 2-3 weeks to ensure your COPD exacerbation is resolved. In the meantime, if you have worsening cough, shortness of breath, fever/chills please call our clinic or seek care in the ER.   If you have any questions or concerns, please feel free to call the clinic.    Be well,  Dr. Darral Dash [facility] [phone]

## 2022-01-28 NOTE — Progress Notes (Signed)
° ° °  SUBJECTIVE:   CHIEF COMPLAINT / HPI:   PPD read Completed by RN. Negative.  COPD exacerbation Worsening cough started one month ago. Having increased clearish sputum production. SOB Taking Anora inhaler as prescribed Flovent not covered due to insurance No chest pain, pleuritic pain, fevers, abdominal pain, or decrease in urine production  HTN Reports compliance with HCTZ BP elevated today. Says she was just running around (dog got lost from her yard) and she just smoked a cigarette prior to taking BP in clinic.  Tobacco use Smokes 10 cigarettes per day.  Ready to quit and desiring nicotine patches  PERTINENT  PMH / PSH: COPD, Tobacco use, GERD, Vit D deficiency  OBJECTIVE:   BP (!) 148/82    Pulse 86    Ht 5\' 4"  (1.626 m)    Wt 240 lb (108.9 kg)    LMP  (LMP Unknown) Comment: "its been a minute"    BMI 41.20 kg/m   General: Non-toxic appearing, pleasant, in no distress. Coughs intermittently CV: RRR, no murmurs appreciated Resp: Mild coarse breath sounds throughout, diminished lung sounds at bases, faint wheezing in upper lungs Ext: No edema Skin: Warm, dry Neuro: Awake, alert, oriented. No focal neuro deficits.  ASSESSMENT/PLAN:   Obstructive lung disease (HCC) Appears to have mild COPD exacerbation- symptoms consistent with increased cough, sputum production and mild dyspnea. Unknown trigger, could be viral/bacterial infection but denies any fever or chills. - Doxycycline BID for 7 days - Prednisone 40mg  daily x 5 days. Take with meals. Told to stop medication immediately and go to urgent care or call clinic if she develops signs of bleeding (has hx GI bleed). - Prescribed Trelegy ellipta. Has cost barriers, but sent to [facility] as this should cost either 4$ or 10$ - Will f/u in 2 weeks. If no symptom resolution, will get X-ray at that time. Will want to ensure she was able to get her medications/is taking them properly. - Strict return precautions  provided  Vitamin D deficiency Will check Vitamin D level today.  - Prescription refilled  Essential hypertension, benign Elevated today in clinic to 166/87. Re-check 20 minutes later 148/82. Had just smoked a cigarette, likely causing further elevation in BP.  - Continue HCTZ 25 mg daily - Re-check at office visit in 2 weeks - Can add Amlodipine 10mg  daily if persistently elevated  Tobacco abuse Currently smoking 10 cigarettes per day. Desires cessation. Sent in nicotine 21mg  patches. Praised patient for desire to quit.     Orvis Brill, [place]

## 2022-01-28 NOTE — Assessment & Plan Note (Signed)
Currently smoking 10 cigarettes per day. Desires cessation. Sent in nicotine 21mg  patches. Praised patient for desire to quit.

## 2022-01-28 NOTE — Assessment & Plan Note (Signed)
Will check Vitamin D level today.  - Prescription refilled

## 2022-01-28 NOTE — Assessment & Plan Note (Signed)
Elevated today in clinic to 166/87. Re-check 20 minutes later 148/82. Had just smoked a cigarette, likely causing further elevation in BP.  - Continue HCTZ 25 mg daily - Re-check at office visit in 2 weeks - Can add Amlodipine 10mg  daily if persistently elevated

## 2022-01-28 NOTE — Progress Notes (Signed)
PPD Reading Note PPD read and results entered in [place]. Result: 0 mm induration. Interpretation: negatice  Allergic reaction: no  Letter provided for employer.     Faven Watterson,CMA

## 2022-02-02 ENCOUNTER — Telehealth: Payer: Self-pay | Admitting: Student

## 2022-02-02 DIAGNOSIS — J441 Chronic obstructive pulmonary disease with (acute) exacerbation: Secondary | ICD-10-CM

## 2022-02-02 MED ORDER — TRELEGY ELLIPTA 100-62.5-25 MCG/ACT IN AEPB: 1.0000 | INHALATION_SPRAY | Freq: Every day | RESPIRATORY_TRACT | 3 refills | Status: DC

## 2022-02-02 NOTE — Telephone Encounter (Signed)
Erroneous

## 2022-02-05 ENCOUNTER — Other Ambulatory Visit (HOSPITAL_BASED_OUTPATIENT_CLINIC_OR_DEPARTMENT_OTHER): Payer: Self-pay

## 2022-02-05 ENCOUNTER — Other Ambulatory Visit: Payer: Self-pay

## 2022-02-09 ENCOUNTER — Other Ambulatory Visit: Payer: Self-pay | Admitting: Pharmacist

## 2022-02-09 DIAGNOSIS — J441 Chronic obstructive pulmonary disease with (acute) exacerbation: Secondary | ICD-10-CM

## 2022-02-09 MED ORDER — TRELEGY ELLIPTA 100-62.5-25 MCG/ACT IN AEPB: 1.0000 | INHALATION_SPRAY | Freq: Every day | RESPIRATORY_TRACT | 3 refills | Status: DC

## 2022-02-12 ENCOUNTER — Other Ambulatory Visit: Payer: Self-pay

## 2022-02-24 ENCOUNTER — Telehealth: Payer: Self-pay

## 2022-02-24 NOTE — Telephone Encounter (Signed)
Patient calls nurse line checking the status of medication assistance for anoro ellipta inhaler.  ? ?Patient reports she filled out paperwork while she was here the "last time."  ? ?Will forward to [place] for guidance.  ?

## 2022-02-25 NOTE — Telephone Encounter (Signed)
Patient returning call - gave patient the message below. Patient states she is completely out of meds.  ?

## 2022-02-25 NOTE — Telephone Encounter (Addendum)
Called patient who stated she is out of Anoro. She will come by this afternoon to pick up samples from the clinic. Will provide her with a 28 day supply.  ? ?Samples of Anoro were given to the patient, quantity 2 inhalers (7 day supply each) from Lot Number [id number] (Exp 04/05/23) + 2 inhalers from Lot Number [id number] (Exp 09/05/23).  ? ?

## 2022-02-25 NOTE — Telephone Encounter (Signed)
Attempted to call patient to give update.  ? ?No answer and mailbox full.  ?

## 2022-03-04 ENCOUNTER — Telehealth: Payer: Self-pay

## 2022-03-04 NOTE — Telephone Encounter (Signed)
Received notification from [employer] regarding approval for TRELEGY. Patient assistance approved from 02/23/22 to 02/24/23. ? ?MEDICATION SHIPPED TO PT'S HOME 02/25/22 ?PT AWARE OF HOW TO CALL COMPANY FOR REFILLS ? ?Phone: [phone] ? ?

## 2022-03-22 ENCOUNTER — Other Ambulatory Visit: Payer: Self-pay | Admitting: Student

## 2022-03-22 DIAGNOSIS — I10 Essential (primary) hypertension: Secondary | ICD-10-CM

## 2022-03-25 ENCOUNTER — Ambulatory Visit
Admission: RE | Admit: 2022-03-25 | Discharge: 2022-03-25 | Disposition: A | Discharge status: Home / Self Care | Payer: No Typology Code available for payment source | Source: Ambulatory Visit | Attending: Family Medicine | Admitting: Family Medicine

## 2022-03-25 DIAGNOSIS — Z0000 Encounter for general adult medical examination without abnormal findings: Secondary | ICD-10-CM

## 2022-03-26 ENCOUNTER — Encounter: Payer: Self-pay | Admitting: Student

## 2022-04-02 ENCOUNTER — Other Ambulatory Visit (HOSPITAL_COMMUNITY): Payer: Self-pay

## 2022-04-15 NOTE — Progress Notes (Signed)
Erroneous encounter

## 2022-06-08 ENCOUNTER — Other Ambulatory Visit: Payer: Self-pay | Admitting: Student

## 2022-06-08 DIAGNOSIS — I10 Essential (primary) hypertension: Secondary | ICD-10-CM

## 2022-06-21 ENCOUNTER — Encounter: Payer: Self-pay | Admitting: Internal Medicine

## 2022-07-16 ENCOUNTER — Ambulatory Visit: Payer: Self-pay

## 2022-07-16 VITALS — Ht 64.0 in | Wt 254.0 lb

## 2022-07-16 DIAGNOSIS — Z1211 Encounter for screening for malignant neoplasm of colon: Secondary | ICD-10-CM

## 2022-07-16 MED ORDER — PEG 3350-KCL-NA BICARB-NACL 420 G PO SOLR: 4000.0000 mL | Freq: Once | ORAL | 0 refills | Status: AC

## 2022-07-16 NOTE — Progress Notes (Signed)
No egg or soy allergy known to patient  No issues known to pt with past sedation with any surgeries or procedures Patient denies ever being told they had issues or difficulty with intubation  No FH of Malignant Hyperthermia Pt is not on diet pills Pt is not on  home 02  Pt is not on blood thinners  Pt denies issues with constipation  No A fib or A flutter Have any cardiac testing pending--denied Pt instructed to use [email or web address] or GoodRx for a price reduction on prep   

## 2022-08-05 ENCOUNTER — Encounter: Payer: No Typology Code available for payment source | Admitting: Internal Medicine

## 2022-08-13 ENCOUNTER — Ambulatory Visit: Payer: Self-pay | Admitting: Family Medicine

## 2022-08-16 ENCOUNTER — Ambulatory Visit (INDEPENDENT_AMBULATORY_CARE_PROVIDER_SITE_OTHER): Payer: Self-pay | Admitting: Family Medicine

## 2022-08-16 ENCOUNTER — Other Ambulatory Visit (HOSPITAL_COMMUNITY)
Admission: RE | Admit: 2022-08-16 | Discharge: 2022-08-16 | Disposition: A | Discharge status: Home / Self Care | Payer: Medicaid Other | Source: Ambulatory Visit | Attending: Family Medicine | Admitting: Family Medicine

## 2022-08-16 VITALS — BP 205/80 | HR 82 | Ht 64.0 in | Wt 248.0 lb

## 2022-08-16 DIAGNOSIS — J449 Chronic obstructive pulmonary disease, unspecified: Secondary | ICD-10-CM

## 2022-08-16 DIAGNOSIS — Z113 Encounter for screening for infections with a predominantly sexual mode of transmission: Secondary | ICD-10-CM

## 2022-08-16 DIAGNOSIS — N949 Unspecified condition associated with female genital organs and menstrual cycle: Secondary | ICD-10-CM | POA: Insufficient documentation

## 2022-08-16 DIAGNOSIS — I10 Essential (primary) hypertension: Secondary | ICD-10-CM

## 2022-08-16 MED ORDER — BENZONATATE 100 MG PO CAPS: ORAL_CAPSULE | ORAL | 0 refills | Status: DC

## 2022-08-16 NOTE — Assessment & Plan Note (Signed)
BP severely elevated to 205/80. Fair compliance with home HCTZ 25mg  but likely needs additional anti-hypertensive agent. Unfortunately patient had a work emergency and left prior to BP recheck and prior to obtaining lab work which included BMP. Return ASAP for BP visit with PCP.

## 2022-08-16 NOTE — Progress Notes (Signed)
    SUBJECTIVE:   CHIEF COMPLAINT / HPI:   Vaginal Discomfort -started 4 days ago -some odor -some dysuria -no discharge, no itching -no increased urinary frequency or urgency -no fever, abdominal pain -patient is post menopausal -sexually active, no new partners -no dyspareunia -no prolapse sensation -no h/o STIs or vaginal issues in the past  HTN -home meds: HCTZ 25mg  daily -misses her medication once or twice a week -did take it this morning -does not check BP at home, does have a cuff though -states she is very stressed as her son was recently in the ICU -also very stressed about her vaginal issue  PERTINENT  PMH / PSH: COPD  OBJECTIVE:   BP (!) 205/80   Pulse 82   Ht 5\' 4"  (1.626 m)   Wt 248 lb (112.5 kg)   LMP  (LMP Unknown) Comment: "its been a minute"   SpO2 97%   BMI 42.57 kg/m   General: NAD, pleasant, able to participate in exam Respiratory: No respiratory distress Skin: warm and dry, no rashes noted Psych: Normal affect and mood Neuro: grossly intact GU/GYN: Exam performed in the presence of a chaperone. External genitalia within normal limits.  Vaginal mucosa pink, moist, normal rugae. Nonfriable cervix without lesions, no discharge or bleeding noted on speculum exam. No pelvic organ prolapse noted.  ASSESSMENT/PLAN:   Vaginal Discomfort Unclear etiology- ddx includes BV, yeast, STI or UTI. Less likely atrophic vaginitis or pelvic organ prolapse as no atrophy or prolapse noted on exam today. GC, chlamydia, trich, BV, and candida testing obtained. Patient had a work emergency and left prior to giving urine sample or obtaining blood work. Return for UA, HIV, RPR.  Essential hypertension, benign BP severely elevated to 205/80. Fair compliance with home HCTZ 25mg  but likely needs additional anti-hypertensive agent. Unfortunately patient had a work emergency and left prior to BP recheck and prior to obtaining lab work which included BMP. Return ASAP for BP  visit with PCP.  Future orders placed for urinalysis, HIV, RPR, and BMP.  , MD [facility]

## 2022-08-16 NOTE — Patient Instructions (Addendum)
It was great to see you!  Today we tested for sexually transmitted infections (gonorrhea, chlamydia, trichomonas, as well as HIV and syphilis). We also checked for vaginal infections that are NOT sexually transmitted (BV and yeast). I will reach out in approximately 2 days with these results.  We will also check your urine for a urinary tract infection.  I have sent refills on your tessalon as requested.  Please see your regular doctor, Dr Marisue Humble, for a blood pressure visit ASAP.  Take care, Dr Anner Crete

## 2022-08-17 LAB — CERVICOVAGINAL ANCILLARY ONLY
Bacterial Vaginitis (gardnerella): NEGATIVE
Candida Glabrata: NEGATIVE
Candida Vaginitis: NEGATIVE
Chlamydia: NEGATIVE
Comment: NEGATIVE
Comment: NEGATIVE
Comment: NEGATIVE
Comment: NEGATIVE
Comment: NEGATIVE
Comment: NORMAL
Neisseria Gonorrhea: NEGATIVE
Trichomonas: NEGATIVE

## 2022-08-20 ENCOUNTER — Telehealth: Payer: Self-pay

## 2022-08-20 ENCOUNTER — Encounter: Payer: Self-pay | Admitting: Family Medicine

## 2022-08-20 NOTE — Telephone Encounter (Signed)
Patient calls nurse line regarding results from visit on 08/16/22. Verified name and DOB. Advised patient of results per note from Dr. Anner Crete.   Patient has no further questions at this time.   Veronda Prude, RN

## 2022-08-30 ENCOUNTER — Other Ambulatory Visit: Payer: Self-pay | Admitting: Student

## 2022-08-30 DIAGNOSIS — I10 Essential (primary) hypertension: Secondary | ICD-10-CM

## 2022-08-31 ENCOUNTER — Telehealth: Payer: Self-pay | Admitting: Internal Medicine

## 2022-08-31 ENCOUNTER — Encounter: Payer: Self-pay | Admitting: Internal Medicine

## 2022-08-31 NOTE — Telephone Encounter (Signed)
Good Morning Dr. Hilarie Fredrickson,  I called this patient at 10:45 am she stated she could not afford procedure.   I will NO SHOW patient Medicaid

## 2022-10-14 ENCOUNTER — Ambulatory Visit (INDEPENDENT_AMBULATORY_CARE_PROVIDER_SITE_OTHER): Payer: Self-pay

## 2022-10-14 DIAGNOSIS — Z23 Encounter for immunization: Secondary | ICD-10-CM

## 2022-10-14 NOTE — Progress Notes (Signed)
Patient presents to clinic for flu vaccination. Administered in RD, site unremarkable, tolerated injection well.   Veronda Prude, RN

## 2022-11-06 ENCOUNTER — Other Ambulatory Visit: Payer: Self-pay | Admitting: Student

## 2022-11-06 DIAGNOSIS — I10 Essential (primary) hypertension: Secondary | ICD-10-CM

## 2022-12-07 ENCOUNTER — Telehealth: Payer: Self-pay

## 2022-12-07 NOTE — Telephone Encounter (Signed)
Returned pts call regarding Trelegy PAP. Pt is out of trelegy and needs refills sent to [facility].   Dr. Joelyn Oms,  Can a signed hardcopy RX be placed in my box for a 90-day supply (w/ refills) of Trelegy 142mcg? I will fax to the company asap.

## 2022-12-08 ENCOUNTER — Other Ambulatory Visit: Payer: Self-pay | Admitting: Student

## 2022-12-08 DIAGNOSIS — J441 Chronic obstructive pulmonary disease with (acute) exacerbation: Secondary | ICD-10-CM

## 2022-12-08 MED ORDER — TRELEGY ELLIPTA 100-62.5-25 MCG/ACT IN AEPB: 1.0000 | INHALATION_SPRAY | Freq: Every day | RESPIRATORY_TRACT | 3 refills | Status: DC

## 2022-12-20 ENCOUNTER — Other Ambulatory Visit: Payer: Self-pay | Admitting: Student

## 2022-12-20 DIAGNOSIS — I10 Essential (primary) hypertension: Secondary | ICD-10-CM

## 2022-12-21 ENCOUNTER — Other Ambulatory Visit: Payer: Self-pay | Admitting: Student

## 2022-12-21 DIAGNOSIS — I10 Essential (primary) hypertension: Secondary | ICD-10-CM

## 2023-01-09 DIAGNOSIS — W1830XA Fall on same level, unspecified, initial encounter: Secondary | ICD-10-CM | POA: Diagnosis not present

## 2023-01-09 DIAGNOSIS — S8991XA Unspecified injury of right lower leg, initial encounter: Secondary | ICD-10-CM | POA: Diagnosis not present

## 2023-01-09 DIAGNOSIS — M79604 Pain in right leg: Secondary | ICD-10-CM | POA: Diagnosis not present

## 2023-01-25 ENCOUNTER — Telehealth: Payer: Self-pay

## 2023-01-25 ENCOUNTER — Ambulatory Visit: Payer: Medicaid Other

## 2023-01-25 NOTE — Telephone Encounter (Signed)
Patient calls nurse line requesting an apt for COPD exacerbation.   Patient reports she has been experiencing cold like sxs for ~ 2 weeks now. She reports cough and congestion. She denies any fevers, body aches or chills.   She has concerns for COPD flare. She was coughing throughout our entire conversation. She was speaking in full sentences.   Patient scheduled for this afternoon for evaluation.   ED precautions discussed in the meantime.

## 2023-02-02 ENCOUNTER — Ambulatory Visit: Payer: Medicaid Other | Admitting: Family Medicine

## 2023-02-02 NOTE — Patient Instructions (Incomplete)
It was nice seeing you today!  Blood work today.  See me in 3 months or whenever is a good for you.  Stay well, Arnesia Vincelette, MD [facility] [phone]  --  Make sure to check out at the front desk before you leave today.  Please arrive at least 15 minutes prior to your scheduled appointments.  If you had blood work today, I will send you a MyChart message or a letter if results are normal. Otherwise, I will give you a call.  If you had a referral placed, they will call you to set up an appointment. Please give us a call if you don't hear back in the next 2 weeks.  If you need additional refills before your next appointment, please call your pharmacy first.  

## 2023-02-02 NOTE — Progress Notes (Deleted)
    SUBJECTIVE:   CHIEF COMPLAINT / HPI:  No chief complaint on file.   Patient seen in urgent care about 3 weeks ago on 2/4 after fall with injury to the right knee.  X-ray obtained showed no fracture, mild degenerative changes.  Immobilizer and crutches were ordered.  PERTINENT  PMH / PSH: HTN, COPD, tobacco use, obesity  Patient Care Team: Eppie Gibson, MD as PCP - General (Family Medicine)   OBJECTIVE:   LMP  (LMP Unknown) Comment: "its been a minute"   Physical Exam      08/16/2022   10:34 AM  Depression screen PHQ 2/9  Decreased Interest 2  Down, Depressed, Hopeless 0  PHQ - 2 Score 2  Altered sleeping 3  Tired, decreased energy 3  Change in appetite 0  Feeling bad or failure about yourself  3  Trouble concentrating 1  Moving slowly or fidgety/restless 1  Suicidal thoughts 0  PHQ-9 Score 13  Difficult doing work/chores Extremely dIfficult     {Show previous vital signs (optional):23777}  {Labs  Heme  Chem  Endocrine  Serology  Results Review (optional):23779}  ASSESSMENT/PLAN:   No problem-specific Assessment & Plan notes found for this encounter.    No follow-ups on file.   [name], MD [place]

## 2023-02-09 ENCOUNTER — Other Ambulatory Visit: Payer: Self-pay | Admitting: Family Medicine

## 2023-02-09 ENCOUNTER — Ambulatory Visit (INDEPENDENT_AMBULATORY_CARE_PROVIDER_SITE_OTHER): Payer: Medicaid Other | Admitting: Family Medicine

## 2023-02-09 ENCOUNTER — Encounter: Payer: Self-pay | Admitting: Family Medicine

## 2023-02-09 VITALS — BP 171/91 | HR 80 | Wt 247.1 lb

## 2023-02-09 DIAGNOSIS — M25561 Pain in right knee: Secondary | ICD-10-CM

## 2023-02-09 DIAGNOSIS — K219 Gastro-esophageal reflux disease without esophagitis: Secondary | ICD-10-CM | POA: Diagnosis not present

## 2023-02-09 DIAGNOSIS — W19XXXD Unspecified fall, subsequent encounter: Secondary | ICD-10-CM

## 2023-02-09 DIAGNOSIS — I10 Essential (primary) hypertension: Secondary | ICD-10-CM

## 2023-02-09 DIAGNOSIS — H6993 Unspecified Eustachian tube disorder, bilateral: Secondary | ICD-10-CM | POA: Diagnosis not present

## 2023-02-09 DIAGNOSIS — Z1211 Encounter for screening for malignant neoplasm of colon: Secondary | ICD-10-CM | POA: Diagnosis not present

## 2023-02-09 DIAGNOSIS — D509 Iron deficiency anemia, unspecified: Secondary | ICD-10-CM

## 2023-02-09 DIAGNOSIS — J449 Chronic obstructive pulmonary disease, unspecified: Secondary | ICD-10-CM | POA: Diagnosis not present

## 2023-02-09 DIAGNOSIS — F39 Unspecified mood [affective] disorder: Secondary | ICD-10-CM | POA: Diagnosis not present

## 2023-02-09 MED ORDER — FERROUS SULFATE 325 (65 FE) MG PO TABS: ORAL_TABLET | ORAL | 3 refills | Status: AC

## 2023-02-09 MED ORDER — FLUTICASONE PROPIONATE 50 MCG/ACT NA SUSP: 1.0000 | Freq: Every day | NASAL | 12 refills | Status: DC

## 2023-02-09 MED ORDER — MELOXICAM 7.5 MG PO TABS: 7.5000 mg | ORAL_TABLET | Freq: Every day | ORAL | 0 refills | Status: DC

## 2023-02-09 MED ORDER — BENZONATATE 100 MG PO CAPS: ORAL_CAPSULE | ORAL | 0 refills | Status: AC

## 2023-02-09 MED ORDER — OMEPRAZOLE 20 MG PO CPDR: 20.0000 mg | DELAYED_RELEASE_CAPSULE | Freq: Every day | ORAL | 1 refills | Status: DC

## 2023-02-09 NOTE — Patient Instructions (Addendum)
It was nice seeing you today!  Try Flonase nasal spray to help with the congestion and ear discomfort.  I am referring you to sports medicine for your knee pain for possible ultrasound.  Stay well, [name], MD [place] [phone]  --  Make sure to check out at the front desk before you leave today.  Please arrive at least 15 minutes prior to your scheduled appointments.  If you had blood work today, I will send you a MyChart message or a letter if results are normal. Otherwise, I will give you a call.  If you had a referral placed, they will call you to set up an appointment. Please give [place] a call if you don't hear back in the next 2 weeks.  If you need additional refills before your next appointment, please call your pharmacy first.

## 2023-02-09 NOTE — Progress Notes (Signed)
SUBJECTIVE:   CHIEF COMPLAINT / HPI:  Chief Complaint  Patient presents with   Ear Fullness    Seen at urgent care 1 month ago for injury on the right knee after falling 2 nights prior.  X-ray obtained did not show any fracture, did show some mild tricompartmental degenerative changes. Still having right knee pain, slowly getting better Had a lot of bruising initially but better, still some dark areas on the leg Walking better but still with some limp  Needs refill on omeprazole, iron supplement, cough medicine (benzonatate).  She sometimes takes benzonatate when her COPD flares up.  She does have some cough but not more than baseline.  Both ears stopped up for 2 weeks.  Needs referral for colonoscopy.  Reports adherence with HCTZ.  PERTINENT  PMH / PSH: GERD, morbid obesity, tobacco abuse, iron deficiency anemia  Patient Care Team: Eppie Gibson, MD as PCP - General (Family Medicine)   OBJECTIVE:   BP (!) 171/91   Pulse 80   Wt 247 lb 2 oz (112.1 kg)   LMP  (LMP Unknown) Comment: "its been a minute"   SpO2 96%   BMI 42.42 kg/m   Physical Exam Constitutional:      General: She is not in acute distress. HENT:     Right Ear: Tympanic membrane is retracted.     Left Ear: There is no impacted cerumen. Tympanic membrane is retracted.     Ears:     Comments: No cerumen noted in left ear canal.  Right ear canal with nonoccluding cerumen. Cardiovascular:     Rate and Rhythm: Normal rate and regular rhythm.  Pulmonary:     Effort: Pulmonary effort is normal. No respiratory distress.     Breath sounds: Normal breath sounds.  Musculoskeletal:     Cervical back: Neck supple.     Comments: Possible swelling of the right knee difficult to discern due to body habitus.  Tenderness to palpation to the medial joint line.  Full ROM with flexion and extension without pain.  5/5 strength with flexion and extension.  Negative varus/valgus stressing.  He negative anterior and  posterior drawer.  Some pain elicited with McMurray testing.  Gait is somewhat antalgic.  Neurological:     Mental Status: She is alert.         02/09/2023    9:02 AM  Depression screen PHQ 2/9  Decreased Interest 3  Down, Depressed, Hopeless 1  PHQ - 2 Score 4  Altered sleeping 3  Tired, decreased energy 3  Change in appetite 3  Feeling bad or failure about yourself  0  Trouble concentrating 3  Moving slowly or fidgety/restless 3  Suicidal thoughts 0  PHQ-9 Score 19  Difficult doing work/chores Somewhat difficult     {Show previous vital signs (optional):23777}    ASSESSMENT/PLAN:   Problem List Items Addressed This Visit       Cardiovascular and Mediastinum   Essential hypertension, benign    Uncontrolled.  With shared decision making decided to continue current medications HCTZ and she will come back in 1 month, monitor her BP.        Respiratory   Obstructive lung disease (HCC)   Relevant Medications   benzonatate (TESSALON) 100 MG capsule     Digestive   GERD   Relevant Medications   omeprazole (PRILOSEC) 20 MG capsule     Other   Episodic mood disorder (HCC)    Previously was on quetiapine, diagnosis  is unclear so do not feel comfortable starting medication without further evaluation. Therapy declined.      Anemia, iron deficiency   Relevant Medications   ferrous sulfate 325 (65 FE) MG tablet   Other Visit Diagnoses     Fall, subsequent encounter    -  Primary   Screen for colon cancer       Relevant Orders   Ambulatory referral to Gastroenterology (screening colonoscopy)   Acute pain of right knee       Relevant Medications   meloxicam (MOBIC) 7.5 MG tablet   Other Relevant Orders   Ambulatory referral to Sports Medicine   Dysfunction of both eustachian tubes       Relevant Medications   fluticasone (FLONASE) 50 MCG/ACT nasal spray       1 month out from fall and right knee injury still having pain and limping, suspect possible meniscal  injury based on history and exam.  Will refer to sports medicine may benefit from further evaluation with ultrasound.  Will treat with 1 week course of meloxicam.  Return in about 4 weeks (around 03/09/2023) for f/u HTN, mood.   [name], MD [place]

## 2023-02-09 NOTE — Assessment & Plan Note (Addendum)
Previously was on quetiapine, diagnosis is unclear so do not feel comfortable starting medication without further evaluation. Therapy declined.

## 2023-02-09 NOTE — Assessment & Plan Note (Signed)
Uncontrolled.  With shared decision making decided to continue current medications HCTZ and she will come back in 1 month, monitor her BP.

## 2023-02-13 ENCOUNTER — Other Ambulatory Visit: Payer: Self-pay | Admitting: Family Medicine

## 2023-02-13 DIAGNOSIS — M25561 Pain in right knee: Secondary | ICD-10-CM

## 2023-02-15 ENCOUNTER — Ambulatory Visit: Payer: Medicaid Other | Admitting: Sports Medicine

## 2023-02-16 ENCOUNTER — Ambulatory Visit: Payer: Medicaid Other | Admitting: Sports Medicine

## 2023-02-18 ENCOUNTER — Other Ambulatory Visit: Payer: Self-pay | Admitting: Student

## 2023-02-18 DIAGNOSIS — M25561 Pain in right knee: Secondary | ICD-10-CM

## 2023-02-22 ENCOUNTER — Other Ambulatory Visit: Payer: Self-pay | Admitting: Family Medicine

## 2023-02-22 DIAGNOSIS — Z1231 Encounter for screening mammogram for malignant neoplasm of breast: Secondary | ICD-10-CM

## 2023-02-24 ENCOUNTER — Encounter: Payer: Self-pay | Admitting: Internal Medicine

## 2023-03-31 ENCOUNTER — Ambulatory Visit
Admission: RE | Admit: 2023-03-31 | Discharge: 2023-03-31 | Disposition: A | Discharge status: Home / Self Care | Payer: Medicaid Other | Source: Ambulatory Visit

## 2023-03-31 DIAGNOSIS — Z1231 Encounter for screening mammogram for malignant neoplasm of breast: Secondary | ICD-10-CM

## 2023-04-01 ENCOUNTER — Ambulatory Visit (AMBULATORY_SURGERY_CENTER): Payer: Medicaid Other

## 2023-04-01 VITALS — Ht 64.0 in | Wt 236.0 lb

## 2023-04-01 DIAGNOSIS — Z1211 Encounter for screening for malignant neoplasm of colon: Secondary | ICD-10-CM

## 2023-04-01 NOTE — Progress Notes (Signed)
No egg or soy allergy known to patient  No issues known to pt with past sedation with any surgeries or procedures Patient denies ever being told they had issues or difficulty with intubation  No FH of Malignant Hyperthermia Pt is not on diet pills Pt is not on  home 02  Pt is not on blood thinners  Pt denies issues with constipation  No A fib or A flutter Have any cardiac testing pending--no  Pt with full mobility  Pt instructed to use Singlecare.com or GoodRx for a price reduction on prep   Patient's chart reviewed by Cathlyn Parsons CNRA prior to previsit and patient appropriate for the [facility].  Previsit completed and red dot placed by patient's name on their procedure day (on provider's schedule).     PV complete. Prep instructions went over with pt and mailed out via mail to address on file.

## 2023-04-22 ENCOUNTER — Telehealth: Payer: Self-pay | Admitting: Internal Medicine

## 2023-04-22 NOTE — Telephone Encounter (Signed)
Patient calling to reschedule 5/21 appt for 7/2, requesting new prep instructions.

## 2023-04-22 NOTE — Telephone Encounter (Signed)
New prep instructions printed. Will be sent to mailing address

## 2023-04-26 ENCOUNTER — Encounter: Payer: Medicaid Other | Admitting: Internal Medicine

## 2023-05-16 ENCOUNTER — Ambulatory Visit (INDEPENDENT_AMBULATORY_CARE_PROVIDER_SITE_OTHER): Payer: Medicaid Other | Admitting: Family Medicine

## 2023-05-16 ENCOUNTER — Other Ambulatory Visit: Payer: Self-pay

## 2023-05-16 VITALS — BP 177/82 | HR 75 | Ht 62.0 in | Wt 247.6 lb

## 2023-05-16 DIAGNOSIS — H1032 Unspecified acute conjunctivitis, left eye: Secondary | ICD-10-CM

## 2023-05-16 DIAGNOSIS — J441 Chronic obstructive pulmonary disease with (acute) exacerbation: Secondary | ICD-10-CM

## 2023-05-16 DIAGNOSIS — K219 Gastro-esophageal reflux disease without esophagitis: Secondary | ICD-10-CM | POA: Diagnosis not present

## 2023-05-16 DIAGNOSIS — I10 Essential (primary) hypertension: Secondary | ICD-10-CM | POA: Diagnosis not present

## 2023-05-16 DIAGNOSIS — H109 Unspecified conjunctivitis: Secondary | ICD-10-CM | POA: Insufficient documentation

## 2023-05-16 MED ORDER — OMEPRAZOLE 20 MG PO CPDR: 20.0000 mg | DELAYED_RELEASE_CAPSULE | Freq: Every day | ORAL | 1 refills | Status: DC

## 2023-05-16 MED ORDER — TRELEGY ELLIPTA 100-62.5-25 MCG/ACT IN AEPB: 1.0000 | INHALATION_SPRAY | Freq: Every day | RESPIRATORY_TRACT | 3 refills | Status: DC

## 2023-05-16 NOTE — Patient Instructions (Addendum)
It was great seeing you today!  For your eye it is most likely a viral infection or allergies. Continue visine drops daily. You can also get Pataday eye drops over the counter for itching and redness. Use warm compresses daily as well to help with the crusting and irritation.   I have scheduled follow up to check on blood pressure next week. If you can check at home a few times a bring that in to your next visit that will also be helpful  There are no Trelegy samples today but you can check again at your follow up appointment.   I have also refilled your Omeprazole  Feel free to call with any questions or concerns at any time, at [phone].   Take care,  Dr. Cora Collum [facility]

## 2023-05-16 NOTE — Assessment & Plan Note (Signed)
Patient presents with 2 days of eye redness, irritation and discharge. Symptoms improving. Exam with mild erythema without edema, discharge, TTP. Likely viral vs allergic given no significant discharge/crusting. Recommended continuing Visine and if experiencing a lot of itching can get Pataday OTC. Recommend warm compresses. Return precautions discussed.

## 2023-05-16 NOTE — Assessment & Plan Note (Signed)
BP 180/86 and on repeat 177/82. Asymptomatic. Did take HCTZ 25mg  today but had been out of medication for about a week due to cost. Recommended checking BP at home and scheduled follow up appt with PCP next week to check again. She would likely benefit from med adjustment

## 2023-05-16 NOTE — Progress Notes (Signed)
    SUBJECTIVE:   CHIEF COMPLAINT / HPI:   Patient presents for concern with her left eye. Thinks she has pink eye or allergies.  Friday went to the store and noticed left eye was irritated and itchy. When she woke u on Saturday had a lot of mucous and crusting. Did have some irritation. No current vision changes. No sick contacts. Symptoms improving.   HTN: BP 180/86 Was out of meds for about a week. Did take it today. Takes HCTZ 25 Feels ok without CP, headaches   Requests refill of Omeprazole and sample of Trelegy   PERTINENT  PMH / PSH: Reviewed   OBJECTIVE:   BP (!) 177/82   Pulse 75   Ht 5\' 2"  (1.575 m)   Wt 247 lb 9.6 oz (112.3 kg)   LMP  (LMP Unknown) Comment: "its been a minute"   SpO2 96%   BMI 45.29 kg/m    Physical exam General: well appearing, NAD HEENT: PERRL. L conjunctiva mildly erythematous without drainage. EOMI. No edema or TTP around orbit Cardiovascular: RRR, no murmurs Lungs: CTAB. Normal WOB Abdomen: soft, non-distended, non-tender Skin: warm, dry. No edema  ASSESSMENT/PLAN:   Conjunctivitis Patient presents with 2 days of eye redness, irritation and discharge. Symptoms improving. Exam with mild erythema without edema, discharge, TTP. Likely viral vs allergic given no significant discharge/crusting. Recommended continuing Visine and if experiencing a lot of itching can get Pataday OTC. Recommend warm compresses. Return precautions discussed.   Essential hypertension, benign BP 180/86 and on repeat 177/82. Asymptomatic. Did take HCTZ 25mg  today but had been out of medication for about a week due to cost. Recommended checking BP at home and scheduled follow up appt with PCP next week to check again. She would likely benefit from med adjustment    Refilled Omeprazole and Trelegy   Cora Collum, DO [facility]

## 2023-05-17 ENCOUNTER — Telehealth: Payer: Self-pay

## 2023-05-17 ENCOUNTER — Other Ambulatory Visit (HOSPITAL_COMMUNITY): Payer: Self-pay

## 2023-05-17 NOTE — Telephone Encounter (Signed)
A Prior Authorization was initiated for this patients TRELEGY through CoverMyMeds.   Key: Bertram Savin

## 2023-05-19 NOTE — Telephone Encounter (Signed)
Prior Auth for patients medication TRELEGY denied by [employer] via CoverMyMeds.   Reason:   CoverMyMeds Key: [id number]

## 2023-05-23 ENCOUNTER — Telehealth: Payer: Self-pay

## 2023-05-23 NOTE — Telephone Encounter (Signed)
Patient attempted to be outreached by Lucelia Lacey on 05/23/23 to discuss hypertension. Left voicemail for patient to return our call at their convenience at [phone].  Dakia Schifano, Student-PharmD 

## 2023-05-24 ENCOUNTER — Ambulatory Visit: Payer: Self-pay | Admitting: Family Medicine

## 2023-05-30 ENCOUNTER — Telehealth: Payer: Medicaid Other

## 2023-05-30 DIAGNOSIS — J449 Chronic obstructive pulmonary disease, unspecified: Secondary | ICD-10-CM

## 2023-05-30 NOTE — Telephone Encounter (Signed)
Patient calls nurse line regarding cost of Trelegy.   PA was recently submitted, however, was denied.   Patient reports that she has received assistance from [employer] in the past with this medication.   Will forward to [place] for further advisement.   Veronda Prude, RN

## 2023-06-01 MED ORDER — ANORO ELLIPTA 62.5-25 MCG/ACT IN AEPB: 1.0000 | INHALATION_SPRAY | Freq: Every day | RESPIRATORY_TRACT | 6 refills | Status: DC

## 2023-06-01 MED ORDER — FLUTICASONE PROPIONATE HFA 110 MCG/ACT IN AERO: 2.0000 | INHALATION_SPRAY | Freq: Two times a day (BID) | RESPIRATORY_TRACT | 12 refills | Status: AC

## 2023-06-01 NOTE — Telephone Encounter (Signed)
Patient returns call to nurse line.   Patient advised medication assistance is not available for Medicaid patients.   Patient is requesting a sample of Trelegy or "samples of anything."   If something else can be called in patient would be appreciative.   Will forward to pharmacy and PCP.

## 2023-06-01 NOTE — Telephone Encounter (Signed)
Contacted patient to discuss Trelegy is not covered on insurance and TWO inhalters will be needed to make the combination of medications previously prescribed.   Discussed exchange with patient's son who was taking her calls as she was not available when I called.   Patient has taken Anoro Ellipta (umeclidinium and vilanterol) in the past.  Prescribed this PLUS fluticasone inhaler.   Patient's son verbalized understanding of treatment plan.  He is aware we do not have any samples of these medications.

## 2023-06-01 NOTE — Addendum Note (Signed)
Addended by: Kathrin Ruddy on: 06/01/2023 04:17 PM   Modules accepted: Orders

## 2023-06-02 NOTE — Telephone Encounter (Signed)
Reviewed and agree with Dr Koval's plan.   

## 2023-06-07 ENCOUNTER — Encounter: Payer: Self-pay | Admitting: Internal Medicine

## 2023-06-07 ENCOUNTER — Ambulatory Visit (AMBULATORY_SURGERY_CENTER): Payer: Medicaid Other | Admitting: Internal Medicine

## 2023-06-07 VITALS — BP 160/79 | HR 58 | Temp 98.4°F | Resp 16 | Ht 64.0 in | Wt 236.0 lb

## 2023-06-07 DIAGNOSIS — J449 Chronic obstructive pulmonary disease, unspecified: Secondary | ICD-10-CM | POA: Diagnosis not present

## 2023-06-07 DIAGNOSIS — I10 Essential (primary) hypertension: Secondary | ICD-10-CM | POA: Diagnosis not present

## 2023-06-07 DIAGNOSIS — Z1211 Encounter for screening for malignant neoplasm of colon: Secondary | ICD-10-CM | POA: Diagnosis not present

## 2023-06-07 DIAGNOSIS — J45909 Unspecified asthma, uncomplicated: Secondary | ICD-10-CM | POA: Diagnosis not present

## 2023-06-07 MED ORDER — SODIUM CHLORIDE 0.9 % IV SOLN
500.0000 mL | Freq: Once | INTRAVENOUS | Status: DC
Start: 2023-06-07 — End: 2023-06-07

## 2023-06-07 NOTE — Progress Notes (Signed)
Pt's states no medical or surgical changes since previsit or office visit. 

## 2023-06-07 NOTE — Progress Notes (Signed)
HISTORY OF PRESENT ILLNESS:  Regina Leach is a 57 y.o. female who is sent today for routine screening colonoscopy.  No complaints  REVIEW OF SYSTEMS:  All non-GI ROS negative except for  Past Medical History:  Diagnosis Date   Asthma    Asthma with acute exacerbation 10/13/2018   COPD exacerbation (HCC) 03/22/2019   Hypertension    Obesity    Tobacco abuse     Past Surgical History:  Procedure Laterality Date   NERVE, TENDON AND ARTERY REPAIR Right 09/24/2017   Procedure: FLEXOR TENDON REPAIR X 4, EXTENSOR TENDON REPAIR TO RING FINGER, AND MEDIAN NERVE REPAIR TO RIGHT FOREARM;  Surgeon: Dairl Ponder, MD;  Location: [facility] OR;  Service: Orthopedics;  Laterality: Right;   WISDOM TOOTH EXTRACTION      Social History KHENNEDI VALA  reports that she has been smoking cigarettes. She has been smoking an average of .25 packs per day. She has never used smokeless tobacco. She reports current alcohol use. She reports that she does not use drugs.  family history includes Breast cancer in her maternal aunt; Cancer in her father and maternal aunt; Diabetes in her maternal aunt and maternal aunt; Hypertension in her mother.  Allergies  Allergen Reactions   Aspirin Hives   Prednisone Other (See Comments)    GI bleeding       PHYSICAL EXAMINATION: Vital signs: BP (!) 199/56   Pulse 73   Temp 98.4 F (36.9 C)   Resp (!) 21   Ht 5\' 4"  (1.626 m)   Wt 236 lb (107 kg)   LMP  (LMP Unknown) Comment: "its been a minute"   SpO2 98%   BMI 40.51 kg/m  General: Well-developed, well-nourished, no acute distress HEENT: Sclerae are anicteric, conjunctiva pink. Oral mucosa intact Lungs: Clear Heart: Regular Abdomen: soft, nontender, nondistended, no obvious ascites, no peritoneal signs, normal bowel sounds. No organomegaly. Extremities: No edema Psychiatric: alert and oriented x3. Cooperative     ASSESSMENT:   Colon cancer screening  PLAN:   Screening  colonoscopy

## 2023-06-07 NOTE — Progress Notes (Signed)
Report to PACU, RN, vss, BBS= Clear.  

## 2023-06-07 NOTE — Op Note (Signed)
[facility] Patient Name: Regina Leach Procedure Date: 06/07/2023 8:42 AM MRN: [id number] Endoscopist: Wilhemina Bonito. Marina Goodell , MD, [id number] Age: 57 Referring MD:  Date of Birth: [birth year] Gender: Female Account #: [id number] Procedure:                Colonoscopy Indications:              Screening for colorectal malignant neoplasm Medicines:                Monitored Anesthesia Care Procedure:                Pre-Anesthesia Assessment:                           - Prior to the procedure, a History and Physical                            was performed, and patient medications and                            allergies were reviewed. The patient's tolerance of                            previous anesthesia was also reviewed. The risks                            and benefits of the procedure and the sedation                            options and risks were discussed with the patient.                            All questions were answered, and informed consent                            was obtained. Prior Anticoagulants: The patient has                            taken no anticoagulant or antiplatelet agents. ASA                            Grade Assessment: II - A patient with mild systemic                            disease. After reviewing the risks and benefits,                            the patient was deemed in satisfactory condition to                            undergo the procedure.                           After obtaining informed consent, the colonoscope  was passed under direct vision. Throughout the                            procedure, the patient's blood pressure, pulse, and                            oxygen saturations were monitored continuously. The                            Olympus CF-HQ190L ([id number]) Colonoscope was                            introduced through the anus and advanced to the the                            cecum,  identified by appendiceal orifice and                            ileocecal valve. The ileocecal valve, appendiceal                            orifice, and rectum were photographed. The quality                            of the bowel preparation was excellent. The                            colonoscopy was performed without difficulty. The                            patient tolerated the procedure well. The bowel                            preparation used was SUPREP via split dose                            instruction. Scope In: 8:57:31 AM Scope Out: 9:07:14 AM Scope Withdrawal Time: 0 hours 6 minutes 57 seconds  Total Procedure Duration: 0 hours 9 minutes 43 seconds  Findings:                 A few small-mouthed diverticula were found in the                            left colon.                           Internal hemorrhoids were found during                            retroflexion. The hemorrhoids were moderate and                            inflamed. Complications:            No immediate complications. Estimated blood loss:  None. Estimated Blood Loss:     Estimated blood loss: none. Impression:               - Diverticulosis in the left colon.                           - Internal hemorrhoids.                           - No specimens collected. Recommendation:           - Repeat colonoscopy in 10 years for screening                            purposes.                           - Patient has a contact number available for                            emergencies. The signs and symptoms of potential                            delayed complications were discussed with the                            patient. Return to normal activities tomorrow.                            Written discharge instructions were provided to the                            patient.                           - Resume previous diet.                           - Continue present  medications. Wilhemina Bonito. Marina Goodell, MD 06/07/2023 9:11:49 AM This report has been signed electronically.

## 2023-06-07 NOTE — Patient Instructions (Addendum)
Repeat colonoscopy in 10 years for screening purposes.  Resume previous diet.  Continue present medications.  Handout on diverticulosis  and hemorrhoids provided.  YOU HAD AN ENDOSCOPIC PROCEDURE TODAY AT THE [facility]:   Refer to the procedure report that was given to you for any specific questions about what was found during the examination.  If the procedure report does not answer your questions, please call your gastroenterologist to clarify.  If you requested that your care partner not be given the details of your procedure findings, then the procedure report has been included in a sealed envelope for you to review at your convenience later.  YOU SHOULD EXPECT: Some feelings of bloating in the abdomen. Passage of more gas than usual.  Walking can help get rid of the air that was put into your GI tract during the procedure and reduce the bloating. If you had a lower endoscopy (such as a colonoscopy or flexible sigmoidoscopy) you may notice spotting of blood in your stool or on the toilet paper. If you underwent a bowel prep for your procedure, you may not have a normal bowel movement for a few days.  Please Note:  You might notice some irritation and congestion in your nose or some drainage.  This is from the oxygen used during your procedure.  There is no need for concern and it should clear up in a day or so.  SYMPTOMS TO REPORT IMMEDIATELY:  Following lower endoscopy (colonoscopy or flexible sigmoidoscopy):  Excessive amounts of blood in the stool  Significant tenderness or worsening of abdominal pains  Swelling of the abdomen that is new, acute  Fever of 100F or higher   For urgent or emergent issues, a gastroenterologist can be reached at any hour by calling [phone]. Do not use MyChart messaging for urgent concerns.    DIET:  We do recommend a small meal at first, but then you may proceed to your regular diet.  Drink plenty of fluids but you should avoid  alcoholic beverages for 24 hours.  ACTIVITY:  You should plan to take it easy for the rest of today and you should NOT DRIVE or use heavy machinery until tomorrow (because of the sedation medicines used during the test).    FOLLOW UP: Our staff will call the number listed on your records the next business day following your procedure.  We will call around 7:15- 8:00 am to check on you and address any questions or concerns that you may have regarding the information given to you following your procedure. If we do not reach you, we will leave a message.     If any biopsies were taken you will be contacted by phone or by letter within the next 1-3 weeks.  Please call us at [phone] if you have not heard about the biopsies in 3 weeks.    SIGNATURES/CONFIDENTIALITY: You and/or your care partner have signed paperwork which will be entered into your electronic medical record.  These signatures attest to the fact that that the information above on your After Visit Summary has been reviewed and is understood.  Full responsibility of the confidentiality of this discharge information lies with you and/or your care-partner.  

## 2023-06-08 ENCOUNTER — Telehealth: Payer: Self-pay

## 2023-06-08 NOTE — Telephone Encounter (Signed)
  Follow up Call-     06/07/2023    8:03 AM  Call back number  Post procedure Call Back phone  # [phone]  Permission to leave phone message Yes     Patient questions:  Do you have a fever, pain , or abdominal swelling? No. Pain Score  0 *  Have you tolerated food without any problems? Yes.    Have you been able to return to your normal activities? Yes.    Do you have any questions about your discharge instructions: Diet   No. Medications  No. Follow up visit  No.  Do you have questions or concerns about your Care? No.  Actions: * If pain score is 4 or above: No action needed, pain <4.

## 2023-06-30 ENCOUNTER — Other Ambulatory Visit: Payer: Self-pay | Admitting: Oral Surgery

## 2023-06-30 DIAGNOSIS — K048 Radicular cyst: Secondary | ICD-10-CM | POA: Diagnosis not present

## 2023-07-05 ENCOUNTER — Ambulatory Visit: Payer: Medicaid Other | Admitting: Family Medicine

## 2023-07-05 ENCOUNTER — Encounter: Payer: Self-pay | Admitting: Family Medicine

## 2023-07-05 VITALS — BP 188/72 | HR 63 | Ht 64.0 in | Wt 248.2 lb

## 2023-07-05 DIAGNOSIS — I10 Essential (primary) hypertension: Secondary | ICD-10-CM | POA: Diagnosis not present

## 2023-07-05 MED ORDER — AMLODIPINE BESYLATE 5 MG PO TABS
5.0000 mg | ORAL_TABLET | Freq: Every day | ORAL | 3 refills | Status: DC
Start: 2023-07-05 — End: 2024-07-20

## 2023-07-05 NOTE — Progress Notes (Cosign Needed Addendum)
  SUBJECTIVE:   CHIEF COMPLAINT / HPI:   Here for BP check - on hydrochlorothiazide 25mg  every day Home BP runs 160s-180s Denies severe HA, vision changes, SOB, CP, abd pain  Stop bang completed - pt high risk for sleep apnea but feels that she would like to defer sleep study at this time   PERTINENT  PMH / PSH:   Past Medical History:  Diagnosis Date   Asthma    Asthma with acute exacerbation 10/13/2018   COPD exacerbation (HCC) 03/22/2019   Hypertension    Obesity    Tobacco abuse     OBJECTIVE:  BP (!) 188/72   Pulse 63   Ht 5\' 4"  (1.626 m)   Wt 248 lb 3.2 oz (112.6 kg)   LMP  (LMP Unknown) Comment: "its been a minute"   SpO2 98%   BMI 42.60 kg/m   General: NAD, pleasant, able to participate in exam Respiratory: No respiratory distress Skin: warm and dry, no rashes noted Psych: Normal affect and mood  ASSESSMENT/PLAN:   1. Essential hypertension Blood pressure elevated today and has had elevated home readings.  Start amlodipine 5 mg, follow-up in 2 to 4 weeks for recheck and can titrate up at that time.  Consider sleep study to evaluate for OSA/OHS - amLODipine (NORVASC) 5 MG tablet; Take 1 tablet (5 mg total) by mouth at bedtime.  Dispense: 90 tablet; Refill: 3  The patient left the visit prior to receiving AVS and wrap-up instructions   Meds ordered this encounter  Medications   amLODipine (NORVASC) 5 MG tablet    Sig: Take 1 tablet (5 mg total) by mouth at bedtime.    Dispense:  90 tablet    Refill:  3   Return in about 2 weeks (around 07/19/2023).  Vonna Drafts, MD [facility]

## 2023-08-09 ENCOUNTER — Other Ambulatory Visit: Payer: Self-pay | Admitting: Student

## 2023-08-09 DIAGNOSIS — I10 Essential (primary) hypertension: Secondary | ICD-10-CM

## 2023-09-13 ENCOUNTER — Other Ambulatory Visit: Payer: Self-pay | Admitting: Student

## 2023-09-13 DIAGNOSIS — J449 Chronic obstructive pulmonary disease, unspecified: Secondary | ICD-10-CM

## 2023-09-13 NOTE — Telephone Encounter (Signed)
Patient came in asking if there was any way she could get samples for her Anoro Ellipta. Stated she has been unable to get it refilled by the pharacy because it has been on back order.

## 2023-09-13 NOTE — Telephone Encounter (Signed)
Spoke with Dr. Raymondo Band regarding samples.   I was advised that we do not currently have any samples of inhalers.   Patient is going to call around to see where we could possibly send refill of inhaler.   She will call back with updated pharmacy.   Veronda Prude, RN

## 2023-09-14 MED ORDER — ANORO ELLIPTA 62.5-25 MCG/ACT IN AEPB
1.0000 | INHALATION_SPRAY | Freq: Every day | RESPIRATORY_TRACT | 6 refills | Status: DC
Start: 2023-09-14 — End: 2024-05-28

## 2023-09-14 NOTE — Addendum Note (Signed)
Addended by: Steva Colder on: 09/14/2023 11:36 AM   Modules accepted: Orders

## 2023-09-14 NOTE — Telephone Encounter (Signed)
Patient calls nurse line in regards to [facility].  She reports [facility] has stock of medication.   Will forward to PCP to send in.

## 2023-09-14 NOTE — Addendum Note (Signed)
Addended by: Steva Colder on: 09/14/2023 11:38 AM   Modules accepted: Orders

## 2023-12-23 ENCOUNTER — Other Ambulatory Visit: Payer: Self-pay

## 2023-12-23 DIAGNOSIS — I10 Essential (primary) hypertension: Secondary | ICD-10-CM

## 2023-12-23 DIAGNOSIS — K219 Gastro-esophageal reflux disease without esophagitis: Secondary | ICD-10-CM

## 2023-12-23 MED ORDER — HYDROCHLOROTHIAZIDE 25 MG PO TABS: ORAL_TABLET | ORAL | 1 refills | Status: DC

## 2023-12-23 MED ORDER — OMEPRAZOLE 20 MG PO CPDR
20.0000 mg | DELAYED_RELEASE_CAPSULE | Freq: Every day | ORAL | 1 refills | Status: DC
Start: 2023-12-23 — End: 2024-01-31

## 2024-01-31 ENCOUNTER — Other Ambulatory Visit: Payer: Self-pay | Admitting: Student

## 2024-01-31 DIAGNOSIS — K219 Gastro-esophageal reflux disease without esophagitis: Secondary | ICD-10-CM

## 2024-02-29 ENCOUNTER — Other Ambulatory Visit: Payer: Self-pay | Admitting: *Deleted

## 2024-02-29 DIAGNOSIS — Z1231 Encounter for screening mammogram for malignant neoplasm of breast: Secondary | ICD-10-CM

## 2024-04-02 ENCOUNTER — Ambulatory Visit
Admission: RE | Admit: 2024-04-02 | Discharge: 2024-04-02 | Disposition: A | Discharge status: Home / Self Care | Source: Ambulatory Visit

## 2024-04-02 DIAGNOSIS — Z1231 Encounter for screening mammogram for malignant neoplasm of breast: Secondary | ICD-10-CM

## 2024-04-05 ENCOUNTER — Other Ambulatory Visit: Payer: Self-pay | Admitting: Student

## 2024-04-05 DIAGNOSIS — K219 Gastro-esophageal reflux disease without esophagitis: Secondary | ICD-10-CM

## 2024-04-13 ENCOUNTER — Other Ambulatory Visit: Payer: Self-pay | Admitting: Student

## 2024-04-13 ENCOUNTER — Telehealth: Payer: Self-pay

## 2024-04-13 DIAGNOSIS — M25561 Pain in right knee: Secondary | ICD-10-CM

## 2024-04-13 MED ORDER — DICLOFENAC SODIUM 1 % EX GEL
2.0000 g | Freq: Four times a day (QID) | CUTANEOUS | 3 refills | Status: DC
Start: 2024-04-13 — End: 2024-07-20

## 2024-04-13 NOTE — Progress Notes (Signed)
 Reordered patient Voltaren  gel

## 2024-04-13 NOTE — Telephone Encounter (Signed)
 Patient calls nurse line requesting refill on Voltaren  gel. She reports needing gel for left sided shoulder pain.   She states that she was previously prescribed medication and that it has been helping her pain.   Medication is not on current medication list.   Please advise.   Elsie Halo, RN

## 2024-05-28 ENCOUNTER — Other Ambulatory Visit: Payer: Self-pay | Admitting: Student

## 2024-05-28 DIAGNOSIS — J449 Chronic obstructive pulmonary disease, unspecified: Secondary | ICD-10-CM

## 2024-06-15 ENCOUNTER — Other Ambulatory Visit: Payer: Self-pay | Admitting: Student

## 2024-06-15 ENCOUNTER — Other Ambulatory Visit: Payer: Self-pay

## 2024-06-15 DIAGNOSIS — I10 Essential (primary) hypertension: Secondary | ICD-10-CM

## 2024-06-15 DIAGNOSIS — K219 Gastro-esophageal reflux disease without esophagitis: Secondary | ICD-10-CM

## 2024-06-15 MED ORDER — HYDROCHLOROTHIAZIDE 25 MG PO TABS: 25.0000 mg | ORAL_TABLET | Freq: Every day | ORAL | 0 refills | Status: DC

## 2024-07-20 ENCOUNTER — Telehealth: Payer: Self-pay

## 2024-07-20 ENCOUNTER — Ambulatory Visit (INDEPENDENT_AMBULATORY_CARE_PROVIDER_SITE_OTHER)

## 2024-07-20 VITALS — BP 194/72 | HR 64 | Ht 63.0 in | Wt 243.8 lb

## 2024-07-20 DIAGNOSIS — I10 Essential (primary) hypertension: Secondary | ICD-10-CM

## 2024-07-20 DIAGNOSIS — J449 Chronic obstructive pulmonary disease, unspecified: Secondary | ICD-10-CM | POA: Diagnosis not present

## 2024-07-20 DIAGNOSIS — F17200 Nicotine dependence, unspecified, uncomplicated: Secondary | ICD-10-CM

## 2024-07-20 DIAGNOSIS — H6993 Unspecified Eustachian tube disorder, bilateral: Secondary | ICD-10-CM | POA: Diagnosis not present

## 2024-07-20 DIAGNOSIS — E559 Vitamin D deficiency, unspecified: Secondary | ICD-10-CM | POA: Diagnosis not present

## 2024-07-20 DIAGNOSIS — M25561 Pain in right knee: Secondary | ICD-10-CM | POA: Diagnosis not present

## 2024-07-20 DIAGNOSIS — Z0000 Encounter for general adult medical examination without abnormal findings: Secondary | ICD-10-CM | POA: Diagnosis not present

## 2024-07-20 MED ORDER — DICLOFENAC SODIUM 1 % EX GEL
2.0000 g | Freq: Four times a day (QID) | CUTANEOUS | 3 refills | Status: DC
Start: 2024-07-20 — End: 2024-07-23

## 2024-07-20 MED ORDER — UMECLIDINIUM-VILANTEROL 62.5-25 MCG/ACT IN AEPB
1.0000 | INHALATION_SPRAY | Freq: Every day | RESPIRATORY_TRACT | 3 refills | Status: DC
Start: 2024-07-20 — End: 2024-07-23

## 2024-07-20 MED ORDER — FLUTICASONE PROPIONATE 50 MCG/ACT NA SUSP: 1.0000 | Freq: Every day | NASAL | 12 refills | Status: DC

## 2024-07-20 MED ORDER — CHOLECALCIFEROL 10 MCG (400 UNIT) PO TABS: 1200.0000 [IU] | ORAL_TABLET | Freq: Every day | ORAL | 2 refills | Status: DC

## 2024-07-20 MED ORDER — HYDROCHLOROTHIAZIDE 25 MG PO TABS: 25.0000 mg | ORAL_TABLET | Freq: Every day | ORAL | 0 refills | Status: DC

## 2024-07-20 MED ORDER — LOSARTAN POTASSIUM 100 MG PO TABS: 100.0000 mg | ORAL_TABLET | Freq: Every day | ORAL | 0 refills | Status: DC

## 2024-07-20 MED ORDER — NICOTINE 7 MG/24HR TD PT24: 7.0000 mg | MEDICATED_PATCH | Freq: Every day | TRANSDERMAL | 0 refills | Status: DC

## 2024-07-20 NOTE — Patient Instructions (Signed)
 Today we are changing your blood pressure medicines. Stop your amlodipine . Start taking Losartan  daily. I have sent in nicotine  patches. Please come back and see me next week. We will go over your blood work, check your blood pressure and do your pap smear.

## 2024-07-20 NOTE — Telephone Encounter (Signed)
 Called patient and reminded her of LAB visit at 0915 on Monday 07/23/2024.    Patient had originally requested 0830 which was not available.  Cena JONELLE Pesa, CMA

## 2024-07-20 NOTE — Assessment & Plan Note (Signed)
 Uncontrolled. 194/72 today. Patient stopped taking her amlodipine  5 mg, due to concerns of swelling. Started Losartan  100 mg today. Will re-check at visit next week.

## 2024-07-20 NOTE — Progress Notes (Signed)
    SUBJECTIVE:   Chief compliant/HPI: annual examination  Regina Leach is a 58 y.o. who presents today for an annual exam.   Patient has some concerns about her medications.  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  I told her that this medication does not have any steroids in it.  Her husband and son both had swelling on amlodipine .  She stopped taking her amlodipine  because of this.  She is worried because her blood pressure is still running high at home, the top number is running between 200s-169.  She states the bottom number is mostly around 80.  She states it only ran high when her top number was 200.  She is interested in smoking today.  She has previously quit smoking.  She is requesting a nicotine  patch to help with that today.  History tabs reviewed and updated.   OBJECTIVE:   BP (!) 194/72 Comment: first pressure would not register with machine  Pulse 64   Ht 5' 3 (1.6 m)   Wt 243 lb 12.8 oz (110.6 kg)   LMP  (LMP Unknown) Comment: its been a minute   SpO2 100%   BMI 43.19 kg/m   General: well appearing female, no acute distress Eyes: Pupils equal, round, reactive Ears: moderate cerumen bilaterally, tympanic membranes clear bilaterally with good light reflex  Nose: No septal deviation, no drainage, clear nasal passages bilaterally Throat: No erythema, no exudates or tonsillar swelling, patient typically wears top dentures (not wearing at this time) Neck: No lymphadenopathy, no thyroid nodule, no neck masses Cardiovascular: RRR, no m/r/g Respiratory: CTAB, normal work of breathing on room air Abdomen: Soft, nondistended, nontender to palpation, bowel sounds present Extremities: +1 lower extremity pitting edema bilaterally, good dorsalis pedis pulses bilaterally   ASSESSMENT/PLAN:   Assessment & Plan Annual physical exam Medications refilled today. Lab visit scheduled for 07/23/24, as there was no  phlebotomist in clinic today. Follow-up scheduled with me 07/26/24 for BP re-check, blood work, an Pap smear.  Essential hypertension, benign Uncontrolled. 194/72 today. Patient stopped taking her amlodipine  5 mg, due to concerns of swelling. Started Losartan  100 mg today. Will re-check at visit next week.  Smoker Currently smokes 0.25 packs per day. Interested in quitting. Has quit in the past. Sent in nicotine  patches 7 mg today.    Annual Examination  See AVS for age appropriate recommendations  PHQ score 7, reviewed.  BP reviewed and not at goal. Stopped amlodipine  5 mg and started Losartan  100 mg daily.   Considered the following items based upon USPSTF recommendations: Diabetes screening: ordered for future, as there was no phlebotomist in clinic today Screening for elevated cholesterol: ordered for future, as there was no phlebotomist in clinic today  Cervical cancer screening: due for PAP today, scheduled for next week when patient returns for lab follow-up Breast cancer screening: performed on 04/02/24, no concern for malignancy repeat in 1 year Colorectal cancer screening: up to date on screening for CRC. Vaccinations declined today, patient states she would like to read about them first.   Lab visit scheduled for 07/23/24, as there was no phlebotomist in clinic today. Follow-up scheduled with me 07/26/24 for BP re-check, blood work, an Pap smear.   Regina KANDICE Lee, DO [facility]

## 2024-07-23 ENCOUNTER — Other Ambulatory Visit

## 2024-07-23 ENCOUNTER — Other Ambulatory Visit: Payer: Self-pay | Admitting: *Deleted

## 2024-07-23 ENCOUNTER — Other Ambulatory Visit: Payer: Self-pay

## 2024-07-23 DIAGNOSIS — Z0000 Encounter for general adult medical examination without abnormal findings: Secondary | ICD-10-CM

## 2024-07-23 DIAGNOSIS — M25561 Pain in right knee: Secondary | ICD-10-CM

## 2024-07-23 DIAGNOSIS — F17200 Nicotine dependence, unspecified, uncomplicated: Secondary | ICD-10-CM

## 2024-07-23 DIAGNOSIS — H6993 Unspecified Eustachian tube disorder, bilateral: Secondary | ICD-10-CM

## 2024-07-23 DIAGNOSIS — E559 Vitamin D deficiency, unspecified: Secondary | ICD-10-CM

## 2024-07-23 DIAGNOSIS — I10 Essential (primary) hypertension: Secondary | ICD-10-CM

## 2024-07-23 DIAGNOSIS — J449 Chronic obstructive pulmonary disease, unspecified: Secondary | ICD-10-CM

## 2024-07-23 MED ORDER — FLUTICASONE PROPIONATE 50 MCG/ACT NA SUSP: 1.0000 | Freq: Every day | NASAL | 12 refills | Status: AC

## 2024-07-23 MED ORDER — UMECLIDINIUM-VILANTEROL 62.5-25 MCG/ACT IN AEPB: 1.0000 | INHALATION_SPRAY | Freq: Every day | RESPIRATORY_TRACT | 3 refills | Status: DC

## 2024-07-23 MED ORDER — DICLOFENAC SODIUM 1 % EX GEL: 2.0000 g | Freq: Four times a day (QID) | CUTANEOUS | 3 refills | Status: DC

## 2024-07-23 MED ORDER — LOSARTAN POTASSIUM 100 MG PO TABS: 100.0000 mg | ORAL_TABLET | Freq: Every day | ORAL | 0 refills | Status: DC

## 2024-07-23 MED ORDER — CHOLECALCIFEROL 10 MCG (400 UNIT) PO TABS
1200.0000 [IU] | ORAL_TABLET | Freq: Every day | ORAL | 2 refills | Status: DC
Start: 2024-07-23 — End: 2024-10-04

## 2024-07-23 MED ORDER — NICOTINE 7 MG/24HR TD PT24: 7.0000 mg | MEDICATED_PATCH | Freq: Every day | TRANSDERMAL | 0 refills | Status: AC

## 2024-07-23 MED ORDER — HYDROCHLOROTHIAZIDE 25 MG PO TABS: 25.0000 mg | ORAL_TABLET | Freq: Every day | ORAL | 0 refills | Status: DC

## 2024-07-24 LAB — CMP14+EGFR
ALT: 15 IU/L (ref 0–32)
AST: 13 IU/L (ref 0–40)
Albumin: 4.4 g/dL (ref 3.8–4.9)
Alkaline Phosphatase: 63 IU/L (ref 44–121)
BUN/Creatinine Ratio: 14 (ref 9–23)
BUN: 10 mg/dL (ref 6–24)
Bilirubin Total: 0.3 mg/dL (ref 0.0–1.2)
CO2: 23 mmol/L (ref 20–29)
Calcium: 9.9 mg/dL (ref 8.7–10.2)
Chloride: 102 mmol/L (ref 96–106)
Creatinine, Ser: 0.72 mg/dL (ref 0.57–1.00)
Globulin, Total: 2.3 g/dL (ref 1.5–4.5)
Glucose: 94 mg/dL (ref 70–99)
Potassium: 4.2 mmol/L (ref 3.5–5.2)
Sodium: 140 mmol/L (ref 134–144)
Total Protein: 6.7 g/dL (ref 6.0–8.5)
eGFR: 97 mL/min/1.73 (ref >59)

## 2024-07-24 LAB — LIPID PANEL
Chol/HDL Ratio: 4.8 ratio — ABNORMAL HIGH (ref 0.0–4.4)
Cholesterol, Total: 208 mg/dL — ABNORMAL HIGH (ref 100–199)
HDL: 43 mg/dL (ref >39)
LDL Chol Calc (NIH): 142 mg/dL — ABNORMAL HIGH (ref 0–99)
Triglycerides: 129 mg/dL (ref 0–149)
VLDL Cholesterol Cal: 23 mg/dL (ref 5–40)

## 2024-07-24 LAB — CBC
Hematocrit: 43 % (ref 34.0–46.6)
Hemoglobin: 14 g/dL (ref 11.1–15.9)
MCH: 28.1 pg (ref 26.6–33.0)
MCHC: 32.6 g/dL (ref 31.5–35.7)
MCV: 86 fL (ref 79–97)
Platelets: 359 x10E3/uL (ref 150–450)
RBC: 4.98 x10E6/uL (ref 3.77–5.28)
RDW: 13.6 % (ref 11.7–15.4)
WBC: 5.1 x10E3/uL (ref 3.4–10.8)

## 2024-07-24 LAB — HEMOGLOBIN A1C
Est. average glucose Bld gHb Est-mCnc: 137 mg/dL
Hgb A1c MFr Bld: 6.4 % — ABNORMAL HIGH (ref 4.8–5.6)

## 2024-07-26 ENCOUNTER — Ambulatory Visit: Payer: Self-pay

## 2024-07-26 ENCOUNTER — Other Ambulatory Visit (HOSPITAL_COMMUNITY)
Admission: RE | Admit: 2024-07-26 | Discharge: 2024-07-26 | Disposition: A | Discharge status: Home / Self Care | Source: Ambulatory Visit | Attending: Family Medicine | Admitting: Family Medicine

## 2024-07-26 VITALS — BP 154/79 | HR 81 | Ht 63.0 in | Wt 242.6 lb

## 2024-07-26 DIAGNOSIS — Z124 Encounter for screening for malignant neoplasm of cervix: Secondary | ICD-10-CM | POA: Insufficient documentation

## 2024-07-26 DIAGNOSIS — I10 Essential (primary) hypertension: Secondary | ICD-10-CM

## 2024-07-26 DIAGNOSIS — E7800 Pure hypercholesterolemia, unspecified: Secondary | ICD-10-CM | POA: Diagnosis not present

## 2024-07-26 DIAGNOSIS — F17200 Nicotine dependence, unspecified, uncomplicated: Secondary | ICD-10-CM

## 2024-07-26 MED ORDER — ATORVASTATIN CALCIUM 20 MG PO TABS: 20.0000 mg | ORAL_TABLET | Freq: Every day | ORAL | 3 refills | Status: DC

## 2024-07-26 NOTE — Progress Notes (Signed)
    SUBJECTIVE:   CHIEF COMPLAINT / HPI:   Follow-up and Pap  We discussed patient's lab results from last week. Patient is agreeable to getting her screening pap today. She has been taking her Losartan  100 mg and her blood pressure is improved from last week. She was able to pick up her nicotine  patches and is working on decreasing her smoking.   PERTINENT  PMH / PSH: HTN, COPD, smoker  OBJECTIVE:   BP (!) 154/79   Pulse 81   Ht 5' 3 (1.6 m)   Wt 242 lb 9.6 oz (110 kg)   LMP  (LMP Unknown) Comment: its been a minute   SpO2 98%   BMI 42.97 kg/m   General: well appearing female, NAD Respiratory: normal work of breathing on room air  Neuro: speaking normally, gait normal, no gross focal deficits  Psych: mood and affect appropriate  GU: per Dr. Delores   ASSESSMENT/PLAN:   Assessment & Plan Primary hypertension Patient currently on Losartan  100 mg and hydrochlorothiazide  25 mg. 154/79 today. Improved from 194/72 last week. Can consider combo pill. Follow-up in 6 weeks for LDL recheck and BP check.  Pap smear for cervical cancer screening Pap done today. Will call patient with results.  Smoker Patient working on quitting. Started on nicotine  patches and tolerating well.  Elevated cholesterol 24.2% ASCVD score. Atorvastatin  20 mg ordered today. Patient counseled on importance of cholesterol medication.     Regina KANDICE Lee, DO [facility] [facility]

## 2024-07-26 NOTE — Patient Instructions (Addendum)
 Your cholesterol was elevated, so I am going to send you in a medication for this to decrease your risk of heart attack and stroke. Continue working to cut/quit smoking.   Your Hemoglobin A1c (the level we use to check sugars in the blood) is elevated in the pre-diabetes range. There is no medication for that at this time. Please try to increase physical activity and watch your diet.   We did a PAP smear for you today. We will call you with those results.   Please follow up in 6 weeks to re-check your cholesterol and BP recheck.

## 2024-07-27 ENCOUNTER — Ambulatory Visit: Payer: Self-pay

## 2024-07-27 LAB — CYTOLOGY - PAP
Adequacy: ABSENT
Comment: NEGATIVE
Diagnosis: NEGATIVE
High risk HPV: NEGATIVE

## 2024-08-30 ENCOUNTER — Other Ambulatory Visit: Payer: Self-pay | Admitting: Family Medicine

## 2024-08-30 DIAGNOSIS — I10 Essential (primary) hypertension: Secondary | ICD-10-CM

## 2024-10-02 ENCOUNTER — Other Ambulatory Visit: Payer: Self-pay

## 2024-10-02 DIAGNOSIS — E559 Vitamin D deficiency, unspecified: Secondary | ICD-10-CM

## 2024-10-03 DIAGNOSIS — H35033 Hypertensive retinopathy, bilateral: Secondary | ICD-10-CM | POA: Diagnosis not present

## 2024-10-03 DIAGNOSIS — H2513 Age-related nuclear cataract, bilateral: Secondary | ICD-10-CM | POA: Diagnosis not present

## 2024-10-08 ENCOUNTER — Other Ambulatory Visit: Payer: Self-pay

## 2024-10-08 DIAGNOSIS — I10 Essential (primary) hypertension: Secondary | ICD-10-CM

## 2024-10-15 ENCOUNTER — Other Ambulatory Visit: Payer: Self-pay

## 2024-10-15 DIAGNOSIS — I10 Essential (primary) hypertension: Secondary | ICD-10-CM

## 2024-10-29 ENCOUNTER — Ambulatory Visit

## 2024-10-31 ENCOUNTER — Ambulatory Visit: Admitting: Family Medicine

## 2024-10-31 ENCOUNTER — Encounter: Payer: Self-pay | Admitting: Family Medicine

## 2024-10-31 VITALS — BP 139/81 | HR 63 | Ht 62.0 in | Wt 248.6 lb

## 2024-10-31 DIAGNOSIS — R2232 Localized swelling, mass and lump, left upper limb: Secondary | ICD-10-CM

## 2024-10-31 DIAGNOSIS — M25561 Pain in right knee: Secondary | ICD-10-CM | POA: Diagnosis not present

## 2024-10-31 DIAGNOSIS — M25512 Pain in left shoulder: Secondary | ICD-10-CM

## 2024-10-31 DIAGNOSIS — M25511 Pain in right shoulder: Secondary | ICD-10-CM | POA: Diagnosis not present

## 2024-10-31 DIAGNOSIS — G8929 Other chronic pain: Secondary | ICD-10-CM

## 2024-10-31 MED ORDER — DICLOFENAC SODIUM 1 % EX GEL: 2.0000 g | Freq: Four times a day (QID) | CUTANEOUS | 3 refills | Status: AC

## 2024-10-31 NOTE — Patient Instructions (Addendum)
 It was wonderful to see you today.  Please bring ALL of your medications with you to every visit.   Today we talked about:  Shoulder pain - I believe your shoulder pain is due to osteoarthritis. This is wear and tear of the joint. You can continue daily tylenol  and take ibuprofen sparingly as needed. Also use voltaren  gel on your shoulders. You can continue to consider shoulder injections for longer term relief.   For the nodule on your thumb there is a broad range of what it could be. I do not think it is emergent but may be irritating that tendon causing you pain. I have ordered an ultrasound look out for a phone call regarding this.   Your blood pressure is above goal please make sure you are taking your medications every single day.   Please follow up in 1 month   Thank you for choosing [facility].   Please call [phone] with any questions about today's appointment.  Please be sure to schedule follow up at the front desk before you leave today.   Areta Saliva, MD  Family Medicine    Blood Pressure Record Sheet To take your blood pressure, you will need a blood pressure machine. You can buy a blood pressure machine (blood pressure monitor) at your clinic, drug store, or online. When choosing one, consider: An automatic monitor that has an arm cuff. A cuff that wraps snugly around your upper arm. You should be able to fit only one finger between your arm and the cuff. A device that stores blood pressure reading results. Do not choose a monitor that measures your blood pressure from your wrist or finger. Follow your health care provider's instructions for how to take your blood pressure. To use this form: Take your blood pressure medications every day These measurements should be taken when you have been at rest for at least 10-15 min Take at least 2 readings with each blood pressure check. This makes sure the results are correct. Wait 1-2 minutes between  measurements. Write down the results in the spaces on this form. Keep in mind it should always be recorded systolic over diastolic. Both numbers are important.  Repeat this every day for 2-3 weeks, or as told by your health care provider.  Make a follow-up appointment with your health care provider to discuss the results.  Blood Pressure Log Date Medications taken? (Y/N) Blood Pressure Time of Day

## 2024-10-31 NOTE — Progress Notes (Signed)
   SUBJECTIVE:   CHIEF COMPLAINT / HPI:  Discussed the use of AI scribe software for clinical note transcription with the patient, who gave verbal consent to proceed.  History of Present Illness Regina Leach is a 58 year old female who presents with bilateral shoulder pain and a knot on her left thumb.  Bilateral shoulder pain - Severe pain present for two months, initially in the right shoulder and subsequently involving the left - Pain worsened by lifting and movement - Interferes with work as a [employer] and disrupts sleep - Tylenol  taken twice daily provides some relief, but concern exists regarding frequent use - Biofreeze has been ineffective - Meloxicam  has helped previously   Left thumb mass - Movable knot present on left thumb - Pain elicited with pressure on the knot - Onset followed a sensation similar to a bite - Mass moves with thumb movement, causing discomfort - Full range of motion of the thumb maintained     PERTINENT  PMH / PSH: Tobacco use   OBJECTIVE:  BP (!) 147/78   Pulse 68   Ht 5' 2 (1.575 m)   Wt 248 lb 9.6 oz (112.8 kg)   LMP  (LMP Unknown) Comment: its been a minute   SpO2 98%   BMI 45.47 kg/m   General: well appearing, in no acute distress Resp: Normal work of breathing on room air Neuro: Alert & Oriented x 4  MSK:  Shoulders: symmetric appearance, limited range of motion on extension and abduction bilaterally, able to raise to about 110 degrees, pain with passive range of motion as well.  Positive empty can test  Negative drop test Left thumb: very small nodule along side of MCP on dorsal surface towards radial side. Mobile and tender to palpation. Not fluctuant. No overlying skin changes.    ASSESSMENT/PLAN:   Assessment & Plan Chronic pain of both shoulders likely osteoarthritis given age and symptoms. Discussed arthritis management, emphasizing smoking cessation, diabetes prevention, and joint mobility exercises. Explained steroid  injections for pain relief, noting potential long-term effects. She prefers Tylenol  and Voltaren  gel for pain management. - Ordered shoulder X-rays to assess for arthritis. - Advised decreasing smoking to prevent arthritis progression. - Recommended exercises to maintain joint mobility and strengthen surrounding muscles. - Discussed potential use of steroid injections for long-term pain relief if pain worsens. - Prescribed Voltaren  gel for topical pain relief, to be used twice daily. - Patient declined injections and physical therapy referral at this time  Nodule of skin of left thumb Mass on left thumb, mobile and painful, likely ganglion cyst, osteoarthritis spur, nodule along tendon, or other benign mass - Given tenderness have ordered ultrasound.     Areta Saliva, MD [facility]

## 2024-11-09 ENCOUNTER — Other Ambulatory Visit

## 2024-11-13 ENCOUNTER — Ambulatory Visit
Admission: RE | Admit: 2024-11-13 | Discharge: 2024-11-13 | Disposition: A | Source: Ambulatory Visit | Attending: Family Medicine

## 2024-11-13 ENCOUNTER — Inpatient Hospital Stay: Admission: RE | Admit: 2024-11-13 | Discharge: 2024-11-13 | Attending: Family Medicine

## 2024-11-13 DIAGNOSIS — R2232 Localized swelling, mass and lump, left upper limb: Secondary | ICD-10-CM

## 2024-11-13 DIAGNOSIS — G8929 Other chronic pain: Secondary | ICD-10-CM

## 2024-11-21 ENCOUNTER — Other Ambulatory Visit: Payer: Self-pay | Admitting: Family Medicine

## 2024-11-21 ENCOUNTER — Other Ambulatory Visit: Payer: Self-pay

## 2024-11-21 DIAGNOSIS — K219 Gastro-esophageal reflux disease without esophagitis: Secondary | ICD-10-CM

## 2024-11-21 DIAGNOSIS — J449 Chronic obstructive pulmonary disease, unspecified: Secondary | ICD-10-CM

## 2024-11-21 DIAGNOSIS — E7800 Pure hypercholesterolemia, unspecified: Secondary | ICD-10-CM

## 2024-11-27 ENCOUNTER — Ambulatory Visit: Payer: Self-pay | Admitting: Family Medicine

## 2024-11-27 NOTE — Telephone Encounter (Signed)
 Called patient regarding shoulder scans to ask if she is having continued pain and schedule an appt to discuss options. Patient did not answer the phone.

## 2024-11-27 NOTE — Telephone Encounter (Signed)
 Patient returns call to nurse line.  Patient reports continued pain and would like to schedule a FU apt with Boyina.   Patient scheduled 12/18/2024.

## 2024-12-18 ENCOUNTER — Ambulatory Visit: Payer: Self-pay | Admitting: Family Medicine

## 2024-12-18 ENCOUNTER — Encounter: Payer: Self-pay | Admitting: Family Medicine

## 2024-12-18 VITALS — BP 167/91 | HR 73 | Ht 62.0 in | Wt 256.1 lb

## 2024-12-18 DIAGNOSIS — M25511 Pain in right shoulder: Secondary | ICD-10-CM

## 2024-12-18 DIAGNOSIS — M19011 Primary osteoarthritis, right shoulder: Secondary | ICD-10-CM | POA: Insufficient documentation

## 2024-12-18 DIAGNOSIS — M25512 Pain in left shoulder: Secondary | ICD-10-CM | POA: Diagnosis not present

## 2024-12-18 DIAGNOSIS — G8929 Other chronic pain: Secondary | ICD-10-CM | POA: Insufficient documentation

## 2024-12-18 DIAGNOSIS — M19012 Primary osteoarthritis, left shoulder: Secondary | ICD-10-CM

## 2024-12-18 DIAGNOSIS — M7531 Calcific tendinitis of right shoulder: Secondary | ICD-10-CM | POA: Insufficient documentation

## 2024-12-18 DIAGNOSIS — I10 Essential (primary) hypertension: Secondary | ICD-10-CM

## 2024-12-18 MED ORDER — LISINOPRIL 20 MG PO TABS: 20.0000 mg | ORAL_TABLET | Freq: Every day | ORAL | 3 refills | Status: AC

## 2024-12-18 NOTE — Progress Notes (Signed)
" ° °  SUBJECTIVE:   CHIEF COMPLAINT / HPI:  Discussed the use of AI scribe software for clinical note transcription with the patient, who gave verbal consent to proceed.  History of Present Illness Regina Leach is a 59 year old female who presents with shoulder pain and medication side effects.  Bilateral shoulder pain and functional limitation - Significant pain in both shoulders - Pain limits ability to dress and lie down - Imaging revealed arthritis in both shoulders - Left shoulder: subchondral cyst present - Right shoulder: calcific tendinitis and small bone spur at tendon attachment  Hypertension and antihypertensive medication side effects - Elevated blood pressure - Developed right-sided morning headaches after losartan  100 mg; headaches resolved after discontinuation - Previously discontinued amlodipine  due to swelling - Wary of trying new antihypertensive medications due to prior side effects - Currently using beetroot and considering lifestyle modifications for blood pressure control.  - Does not want to stop smoking at this time.   Depressed mood related to chronic pain - Depressed mood attributed to chronic pain and its impact on interactions with her children - Declines therapy or counseling - Believes mood would improve if pain was better controlled    PERTINENT  PMH / PSH: HTN, COPD   OBJECTIVE:  LMP  (LMP Unknown) Comment: its been a minute   Physical Exam   General: well appearing, in no acute distress CV: RRR, radial pulses equal and palpable, no BLE edema  Resp: Normal work of breathing on room air, CTAB Abd: Soft, non tender, non distended  Neuro: Alert & Oriented x 4    11/13/2024 DG Shoulder Left:  1. Mild acromioclavicular joint osteoarthritis. 2. Subchondral cyst of the humeral head which could reflect rotator cuff pathology.  11/13/2024 DG Shoulder Right  1. Small calcification adjacent to the greater tubercle consistent with  rotator cuff calcific tendinitis. 2. Mild osteoarthritis of the acromioclavicular joint. 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 Left shoulder arthritis with cyst, possibly from rotator cuff stress or tear. Rotator cuff pathology more likely given significant OA. Right shoulder arthritis with calcific tendinitis and calcium  deposit. Pain impacts daily activities and sleep. She is against injections or further evaluation or management with sports medicine or orthopedics. Patient declined physical therapy. Very adamant she wants to go to the pain clinic.  - Pain clinic referral  Essential hypertension Uncontrolled off of losartan  (patient stopped due to headache side effects). On hydrochlorothiazide . Amlodipine  previously caused side effects as well.  - Counseled patient on risks of using herbal therapies  - Start lisinopril  20 mg  - Continue hydrochlorothiazide  25 mg  - Follow up in 2 weeks for BP recheck and BMP  - Discussed DASH diet, smoking cessation, and weight loss for natural therapies     Areta Saliva, MD [facility] "

## 2024-12-18 NOTE — Assessment & Plan Note (Signed)
 Chronic shoulder pain due to osteoarthritis and calcific tendinitis. Left shoulder arthritis with cyst, possibly from rotator cuff stress or tear. Rotator cuff pathology more likely given significant OA. Right shoulder arthritis with calcific tendinitis and calcium  deposit. Pain impacts daily activities and sleep. She is against injections or further evaluation or management with sports medicine or orthopedics. Patient declined physical therapy. Very adamant she wants to go to the pain clinic.  - Pain clinic referral

## 2024-12-18 NOTE — Patient Instructions (Signed)
 It was wonderful to see you today.  Please bring ALL of your medications with you to every visit.   We discussed your shoulder pain. Your preference was to not do further evaluation with orthopedics or sports medicine or get injections. You wanted pain clinic referral so that was placed.   Your blood pressure was high we switched you from losartan  to lisinopril  due to side effects.  You need to follow up in 2 weeks to check your labs and blood pressure.   Thank you for choosing [facility].   Please call [phone] with any questions about today's appointment.  Areta Saliva, MD  Family Medicine

## 2024-12-18 NOTE — Assessment & Plan Note (Signed)
 Uncontrolled off of losartan  (patient stopped due to headache side effects). On hydrochlorothiazide . Amlodipine  previously caused side effects as well.  - Counseled patient on risks of using herbal therapies  - Start lisinopril  20 mg  - Continue hydrochlorothiazide  25 mg  - Follow up in 2 weeks for BP recheck and BMP  - Discussed DASH diet, smoking cessation, and weight loss for natural therapies

## 2025-01-01 ENCOUNTER — Ambulatory Visit: Payer: Self-pay | Admitting: Family Medicine

## 2025-01-14 ENCOUNTER — Ambulatory Visit
